# Patient Record
Sex: Male | Born: 1940
Health system: Southern US, Community
[De-identification: ages and names within clinical notes are randomized; demographics above are authoritative.]

## PROBLEM LIST (undated history)

## (undated) DIAGNOSIS — K219 Gastro-esophageal reflux disease without esophagitis: Secondary | ICD-10-CM

## (undated) DIAGNOSIS — R918 Other nonspecific abnormal finding of lung field: Secondary | ICD-10-CM

## (undated) DIAGNOSIS — I739 Peripheral vascular disease, unspecified: Secondary | ICD-10-CM

## (undated) DIAGNOSIS — R6883 Chills (without fever): Secondary | ICD-10-CM

## (undated) DIAGNOSIS — I723 Aneurysm of iliac artery: Secondary | ICD-10-CM

## (undated) DIAGNOSIS — R509 Fever, unspecified: Secondary | ICD-10-CM

## (undated) DIAGNOSIS — I10 Essential (primary) hypertension: Secondary | ICD-10-CM

## (undated) DIAGNOSIS — C801 Malignant (primary) neoplasm, unspecified: Secondary | ICD-10-CM

## (undated) DIAGNOSIS — K439 Ventral hernia without obstruction or gangrene: Secondary | ICD-10-CM

## (undated) DIAGNOSIS — R079 Chest pain, unspecified: Secondary | ICD-10-CM

## (undated) DIAGNOSIS — M179 Osteoarthritis of knee, unspecified: Secondary | ICD-10-CM

## (undated) DIAGNOSIS — I679 Cerebrovascular disease, unspecified: Secondary | ICD-10-CM

## (undated) DIAGNOSIS — H9319 Tinnitus, unspecified ear: Secondary | ICD-10-CM

## (undated) DIAGNOSIS — G8929 Other chronic pain: Secondary | ICD-10-CM

## (undated) DIAGNOSIS — N400 Enlarged prostate without lower urinary tract symptoms: Secondary | ICD-10-CM

## (undated) DIAGNOSIS — J449 Chronic obstructive pulmonary disease, unspecified: Secondary | ICD-10-CM

## (undated) DIAGNOSIS — G629 Polyneuropathy, unspecified: Secondary | ICD-10-CM

## (undated) DIAGNOSIS — Z87898 Personal history of other specified conditions: Secondary | ICD-10-CM

## (undated) DIAGNOSIS — M1990 Unspecified osteoarthritis, unspecified site: Secondary | ICD-10-CM

## (undated) DIAGNOSIS — E785 Hyperlipidemia, unspecified: Secondary | ICD-10-CM

## (undated) DIAGNOSIS — K5730 Diverticulosis of large intestine without perforation or abscess without bleeding: Secondary | ICD-10-CM

## (undated) DIAGNOSIS — R109 Unspecified abdominal pain: Secondary | ICD-10-CM

## (undated) DIAGNOSIS — N200 Calculus of kidney: Secondary | ICD-10-CM

## (undated) DIAGNOSIS — M549 Dorsalgia, unspecified: Secondary | ICD-10-CM

## (undated) DIAGNOSIS — R39198 Other difficulties with micturition: Secondary | ICD-10-CM

## (undated) DIAGNOSIS — Z8701 Personal history of pneumonia (recurrent): Secondary | ICD-10-CM

## (undated) DIAGNOSIS — M1710 Unilateral primary osteoarthritis, unspecified knee: Secondary | ICD-10-CM

## (undated) HISTORY — PX: CHOLECYSTECTOMY: SHX55

## (undated) HISTORY — DX: Chest pain, unspecified: R07.9

## (undated) HISTORY — DX: Tinnitus, unspecified ear: H93.19

## (undated) HISTORY — DX: Malignant (primary) neoplasm, unspecified: C80.1

## (undated) HISTORY — DX: Essential (primary) hypertension: I10

## (undated) HISTORY — DX: Diverticulosis of large intestine without perforation or abscess without bleeding: K57.30

## (undated) HISTORY — DX: Chills (without fever): R68.83

## (undated) HISTORY — DX: Osteoarthritis of knee, unspecified: M17.9

## (undated) HISTORY — DX: Hyperlipidemia, unspecified: E78.5

## (undated) HISTORY — DX: Unspecified osteoarthritis, unspecified site: M19.90

## (undated) HISTORY — DX: Other nonspecific abnormal finding of lung field: R91.8

## (undated) HISTORY — DX: Unilateral primary osteoarthritis, unspecified knee: M17.10

## (undated) HISTORY — PX: APPENDECTOMY: SHX54

## (undated) HISTORY — DX: Other difficulties with micturition: R39.198

## (undated) HISTORY — DX: Polyneuropathy, unspecified: G62.9

## (undated) HISTORY — DX: Peripheral vascular disease, unspecified: I73.9

## (undated) HISTORY — DX: Dorsalgia, unspecified: M54.9

## (undated) HISTORY — PX: ABDOMINAL AORTIC ANEURYSM REPAIR: SUR1152

## (undated) HISTORY — DX: Fever, unspecified: R50.9

## (undated) HISTORY — DX: Aneurysm of iliac artery: I72.3

## (undated) HISTORY — DX: Personal history of other specified conditions: Z87.898

## (undated) HISTORY — DX: Other chronic pain: G89.29

## (undated) HISTORY — PX: ESOPHAGOGASTRODUODENOSCOPY: SHX1529

## (undated) HISTORY — DX: Chronic obstructive pulmonary disease, unspecified: J44.9

## (undated) HISTORY — DX: Ventral hernia without obstruction or gangrene: K43.9

## (undated) HISTORY — DX: Gastro-esophageal reflux disease without esophagitis: K21.9

## (undated) HISTORY — DX: Personal history of pneumonia (recurrent): Z87.01

## (undated) HISTORY — PX: OTHER SURGICAL HISTORY: SHX169

## (undated) HISTORY — PX: HERNIA REPAIR: SHX51

## (undated) HISTORY — PX: AORTA SURGERY: SHX548

## (undated) HISTORY — DX: Unspecified abdominal pain: R10.9

## (undated) HISTORY — DX: Cerebrovascular disease, unspecified: I67.9

## (undated) HISTORY — DX: Calculus of kidney: N20.0

## (undated) HISTORY — DX: Benign prostatic hyperplasia without lower urinary tract symptoms: N40.0

---

## 1998-10-03 ENCOUNTER — Encounter: Payer: Self-pay | Admitting: Internal Medicine

## 1998-10-03 ENCOUNTER — Inpatient Hospital Stay (HOSPITAL_COMMUNITY): Admission: EM | Admit: 1998-10-03 | Discharge: 1998-10-06 | Payer: Self-pay | Admitting: Internal Medicine

## 1998-10-05 ENCOUNTER — Encounter: Payer: Self-pay | Admitting: Vascular Surgery

## 1998-11-11 ENCOUNTER — Emergency Department (HOSPITAL_COMMUNITY): Admission: EM | Admit: 1998-11-11 | Discharge: 1998-11-11 | Payer: Self-pay | Admitting: Emergency Medicine

## 1998-11-11 ENCOUNTER — Encounter: Payer: Self-pay | Admitting: Emergency Medicine

## 1999-12-16 ENCOUNTER — Ambulatory Visit (HOSPITAL_COMMUNITY): Admission: RE | Admit: 1999-12-16 | Discharge: 1999-12-16 | Payer: Self-pay | Admitting: Neurosurgery

## 1999-12-28 ENCOUNTER — Ambulatory Visit (HOSPITAL_COMMUNITY): Admission: RE | Admit: 1999-12-28 | Discharge: 1999-12-28 | Payer: Self-pay | Admitting: Neurosurgery

## 2000-12-29 ENCOUNTER — Encounter: Admission: RE | Admit: 2000-12-29 | Discharge: 2000-12-29 | Payer: Self-pay | Admitting: Internal Medicine

## 2000-12-29 ENCOUNTER — Encounter: Payer: Self-pay | Admitting: Internal Medicine

## 2001-01-02 ENCOUNTER — Encounter: Payer: Self-pay | Admitting: Internal Medicine

## 2001-01-02 ENCOUNTER — Encounter: Admission: RE | Admit: 2001-01-02 | Discharge: 2001-01-02 | Payer: Self-pay | Admitting: Internal Medicine

## 2001-01-14 ENCOUNTER — Encounter: Payer: Self-pay | Admitting: *Deleted

## 2001-01-16 ENCOUNTER — Ambulatory Visit (HOSPITAL_COMMUNITY): Admission: RE | Admit: 2001-01-16 | Discharge: 2001-01-16 | Payer: Self-pay | Admitting: *Deleted

## 2001-02-17 ENCOUNTER — Encounter: Payer: Self-pay | Admitting: *Deleted

## 2001-02-19 ENCOUNTER — Encounter: Payer: Self-pay | Admitting: *Deleted

## 2001-02-19 ENCOUNTER — Inpatient Hospital Stay (HOSPITAL_COMMUNITY): Admission: RE | Admit: 2001-02-19 | Discharge: 2001-02-26 | Payer: Self-pay | Admitting: *Deleted

## 2001-02-19 ENCOUNTER — Encounter (INDEPENDENT_AMBULATORY_CARE_PROVIDER_SITE_OTHER): Payer: Self-pay | Admitting: *Deleted

## 2001-02-20 ENCOUNTER — Encounter: Payer: Self-pay | Admitting: *Deleted

## 2001-03-09 ENCOUNTER — Encounter: Payer: Self-pay | Admitting: Vascular Surgery

## 2001-03-09 ENCOUNTER — Emergency Department (HOSPITAL_COMMUNITY): Admission: EM | Admit: 2001-03-09 | Discharge: 2001-03-09 | Payer: Self-pay

## 2001-06-30 ENCOUNTER — Encounter: Admission: RE | Admit: 2001-06-30 | Discharge: 2001-06-30 | Payer: Self-pay | Admitting: Neurosurgery

## 2001-07-17 ENCOUNTER — Encounter: Payer: Self-pay | Admitting: *Deleted

## 2001-07-17 ENCOUNTER — Encounter: Payer: Self-pay | Admitting: Emergency Medicine

## 2001-07-17 ENCOUNTER — Emergency Department (HOSPITAL_COMMUNITY): Admission: EM | Admit: 2001-07-17 | Discharge: 2001-07-17 | Payer: Self-pay | Admitting: *Deleted

## 2001-12-22 ENCOUNTER — Encounter (INDEPENDENT_AMBULATORY_CARE_PROVIDER_SITE_OTHER): Payer: Self-pay | Admitting: *Deleted

## 2001-12-22 ENCOUNTER — Ambulatory Visit (HOSPITAL_COMMUNITY): Admission: RE | Admit: 2001-12-22 | Discharge: 2001-12-22 | Payer: Self-pay | Admitting: Gastroenterology

## 2001-12-22 ENCOUNTER — Encounter: Payer: Self-pay | Admitting: Gastroenterology

## 2001-12-30 ENCOUNTER — Encounter: Admission: RE | Admit: 2001-12-30 | Discharge: 2001-12-30 | Payer: Self-pay | Admitting: Gastroenterology

## 2001-12-30 ENCOUNTER — Encounter: Payer: Self-pay | Admitting: Gastroenterology

## 2002-05-06 ENCOUNTER — Ambulatory Visit (HOSPITAL_BASED_OUTPATIENT_CLINIC_OR_DEPARTMENT_OTHER): Admission: RE | Admit: 2002-05-06 | Discharge: 2002-05-06 | Payer: Self-pay | Admitting: Internal Medicine

## 2002-07-19 ENCOUNTER — Ambulatory Visit (HOSPITAL_BASED_OUTPATIENT_CLINIC_OR_DEPARTMENT_OTHER): Admission: RE | Admit: 2002-07-19 | Discharge: 2002-07-19 | Payer: Self-pay | Admitting: Internal Medicine

## 2002-10-26 ENCOUNTER — Ambulatory Visit (HOSPITAL_BASED_OUTPATIENT_CLINIC_OR_DEPARTMENT_OTHER): Admission: RE | Admit: 2002-10-26 | Discharge: 2002-10-27 | Payer: Self-pay | Admitting: Surgery

## 2004-08-09 ENCOUNTER — Ambulatory Visit (HOSPITAL_COMMUNITY): Admission: RE | Admit: 2004-08-09 | Discharge: 2004-08-09 | Payer: Self-pay | Admitting: Orthopaedic Surgery

## 2004-10-05 ENCOUNTER — Encounter: Admission: RE | Admit: 2004-10-05 | Discharge: 2004-10-05 | Payer: Self-pay | Admitting: Internal Medicine

## 2004-12-11 ENCOUNTER — Encounter: Admission: RE | Admit: 2004-12-11 | Discharge: 2004-12-11 | Payer: Self-pay | Admitting: Internal Medicine

## 2005-02-14 ENCOUNTER — Inpatient Hospital Stay (HOSPITAL_COMMUNITY): Admission: RE | Admit: 2005-02-14 | Discharge: 2005-02-21 | Payer: Self-pay | Admitting: General Surgery

## 2005-02-14 ENCOUNTER — Encounter (INDEPENDENT_AMBULATORY_CARE_PROVIDER_SITE_OTHER): Payer: Self-pay | Admitting: *Deleted

## 2005-02-21 ENCOUNTER — Ambulatory Visit: Payer: Self-pay | Admitting: Hematology and Oncology

## 2005-03-11 ENCOUNTER — Ambulatory Visit (HOSPITAL_COMMUNITY): Admission: RE | Admit: 2005-03-11 | Discharge: 2005-03-11 | Payer: Self-pay | Admitting: Hematology and Oncology

## 2005-04-25 ENCOUNTER — Encounter: Admission: RE | Admit: 2005-04-25 | Discharge: 2005-04-25 | Payer: Self-pay | Admitting: General Surgery

## 2005-05-24 ENCOUNTER — Ambulatory Visit (HOSPITAL_COMMUNITY): Admission: RE | Admit: 2005-05-24 | Discharge: 2005-05-24 | Payer: Self-pay | Admitting: Hematology and Oncology

## 2005-06-05 ENCOUNTER — Ambulatory Visit: Payer: Self-pay | Admitting: Hematology and Oncology

## 2005-08-14 ENCOUNTER — Encounter: Admission: RE | Admit: 2005-08-14 | Discharge: 2005-08-14 | Payer: Self-pay | Admitting: Internal Medicine

## 2005-09-19 ENCOUNTER — Ambulatory Visit (HOSPITAL_COMMUNITY): Admission: RE | Admit: 2005-09-19 | Discharge: 2005-09-19 | Payer: Self-pay | Admitting: Hematology and Oncology

## 2005-09-23 ENCOUNTER — Ambulatory Visit: Payer: Self-pay | Admitting: Hematology and Oncology

## 2005-11-21 ENCOUNTER — Emergency Department (HOSPITAL_COMMUNITY): Admission: EM | Admit: 2005-11-21 | Discharge: 2005-11-21 | Payer: Self-pay | Admitting: Emergency Medicine

## 2006-01-10 ENCOUNTER — Ambulatory Visit: Payer: Self-pay | Admitting: Hematology and Oncology

## 2006-01-14 ENCOUNTER — Ambulatory Visit (HOSPITAL_COMMUNITY): Admission: RE | Admit: 2006-01-14 | Discharge: 2006-01-14 | Payer: Self-pay | Admitting: Hematology and Oncology

## 2006-01-14 LAB — CBC WITH DIFFERENTIAL/PLATELET
BASO%: 0.3 % (ref 0.0–2.0)
Eosinophils Absolute: 0 10*3/uL (ref 0.0–0.5)
HCT: 42.5 % (ref 38.7–49.9)
MCHC: 34.3 g/dL (ref 32.0–35.9)
MONO#: 0.6 10*3/uL (ref 0.1–0.9)
NEUT#: 3.5 10*3/uL (ref 1.5–6.5)
NEUT%: 58.8 % (ref 40.0–75.0)
Platelets: 220 10*3/uL (ref 145–400)
RBC: 4.48 10*6/uL (ref 4.20–5.71)
WBC: 6 10*3/uL (ref 4.0–10.0)
lymph#: 1.8 10*3/uL (ref 0.9–3.3)

## 2006-01-14 LAB — COMPREHENSIVE METABOLIC PANEL
ALT: 25 U/L (ref 0–40)
CO2: 28 mEq/L (ref 19–32)
Calcium: 9.6 mg/dL (ref 8.4–10.5)
Chloride: 105 mEq/L (ref 96–112)
Glucose, Bld: 101 mg/dL — ABNORMAL HIGH (ref 70–99)
Sodium: 141 mEq/L (ref 135–145)
Total Protein: 7 g/dL (ref 6.0–8.3)

## 2006-04-16 ENCOUNTER — Ambulatory Visit: Payer: Self-pay | Admitting: Hematology and Oncology

## 2006-04-18 LAB — CBC WITH DIFFERENTIAL/PLATELET
BASO%: 0.3 % (ref 0.0–2.0)
Basophils Absolute: 0 10*3/uL (ref 0.0–0.1)
Eosinophils Absolute: 0 10*3/uL (ref 0.0–0.5)
HCT: 39.6 % (ref 38.7–49.9)
HGB: 13.8 g/dL (ref 13.0–17.1)
LYMPH%: 32.5 % (ref 14.0–48.0)
MONO#: 0.3 10*3/uL (ref 0.1–0.9)
NEUT#: 2.9 10*3/uL (ref 1.5–6.5)
NEUT%: 60 % (ref 40.0–75.0)
Platelets: 211 10*3/uL (ref 145–400)
WBC: 4.8 10*3/uL (ref 4.0–10.0)
lymph#: 1.6 10*3/uL (ref 0.9–3.3)

## 2006-04-18 LAB — COMPREHENSIVE METABOLIC PANEL
ALT: 21 U/L (ref 0–40)
CO2: 21 mEq/L (ref 19–32)
Calcium: 9.1 mg/dL (ref 8.4–10.5)
Chloride: 107 mEq/L (ref 96–112)
Creatinine, Ser: 1.36 mg/dL (ref 0.40–1.50)
Glucose, Bld: 112 mg/dL — ABNORMAL HIGH (ref 70–99)
Total Bilirubin: 0.5 mg/dL (ref 0.3–1.2)

## 2006-06-05 IMAGING — CT CT ANGIO CHEST
4 of 7 series · 16 of 30 positions shown · IV contrast (omnipaque)
Comparison: CT chest of 09/19/2005.

CLINICAL DATA: Midchest pain.  Recent AAA repair.  
CT ANGIOGRAPHY OF CHEST:
TECHNIQUE: Multidetector CT imaging of the chest was performed during bolus injection of intravenous contrast.  Multiplanar CT angiographic image reconstructions were generated to evaluate the vascular anatomy.
Contrast:  120 cc Omnipaque 300.
TECHNIQUE: Multidetector CT imaging of the abdomen was performed during bolus injection of intravenous contrast.  Multiplanar CT angiographic image reconstructions were generated to evaluate the vascular anatomy.
Scans were continued through the abdomen after IV contrast media was given for CT angio abdomen.  Atheromatous changes are noted throughout the abdominal aorta.  The origin of the SMA, celiac axis, and renal arteries are patent.  The patient has undergone prior AAA repair and the iliac limbs opacify well.  The remainder of the study shows probable fatty infiltration of the liver without focal abnormality.  The gallbladder has previously been removed.  The pancreas is normal in size with normal peripancreatic fat planes.  The adrenal glands and spleen appear normal.  The kidneys enhance normally and the pelvocaliceal systems appear normal.  The appendix appears normal.  There are diverticula in the rectosigmoid colon.

[Series 5: aortic disection · axial · 0.74mm/px · z∈[-511,-116]mm · 7 of 222 slices shown]
[im 32/222  lung]
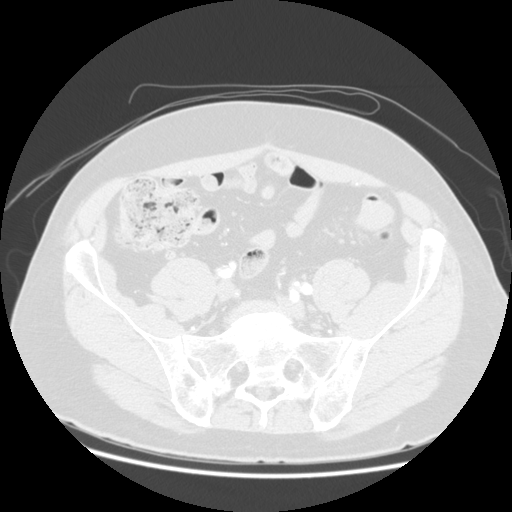
[im 64/222  mediastinal]
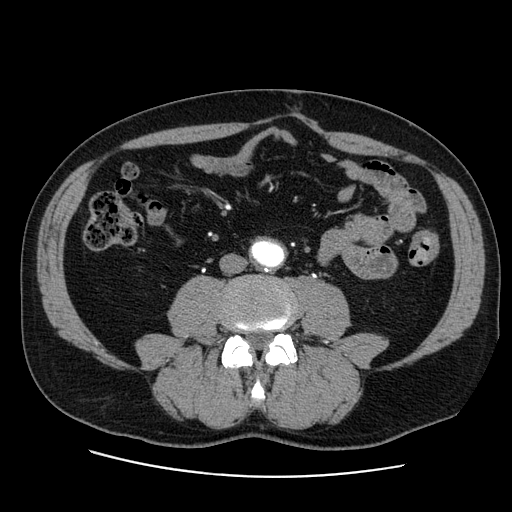
[im 95/222  lung]
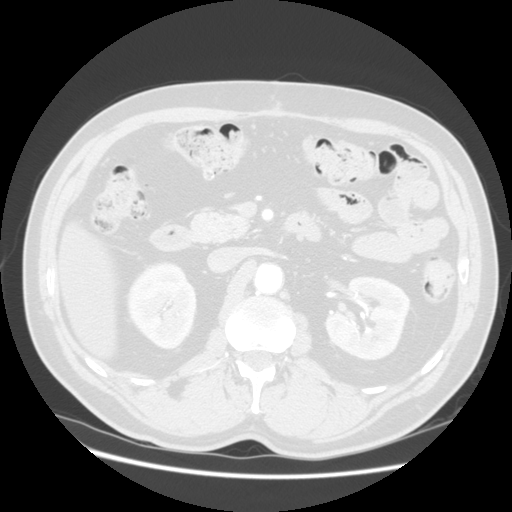
[im 110/222  mediastinal]
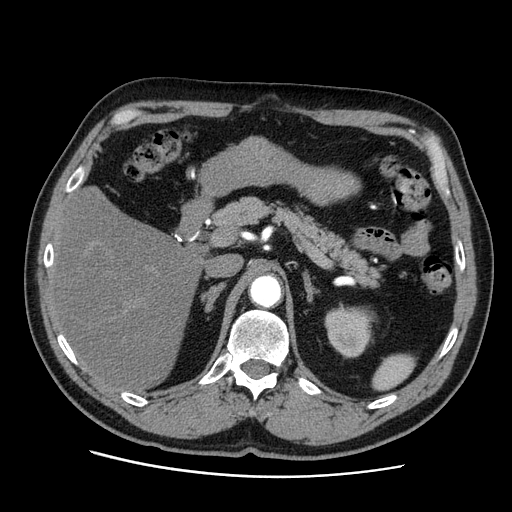
[im 127/222  lung]
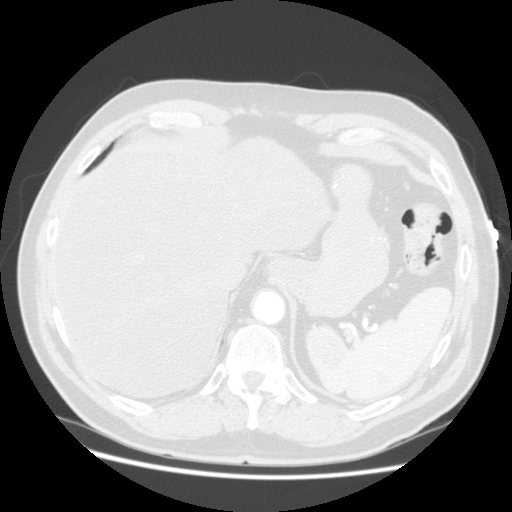
[im 158/222  mediastinal]
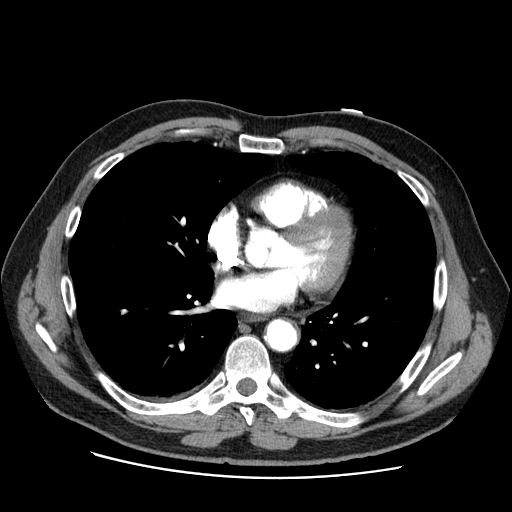
[im 190/222  lung]
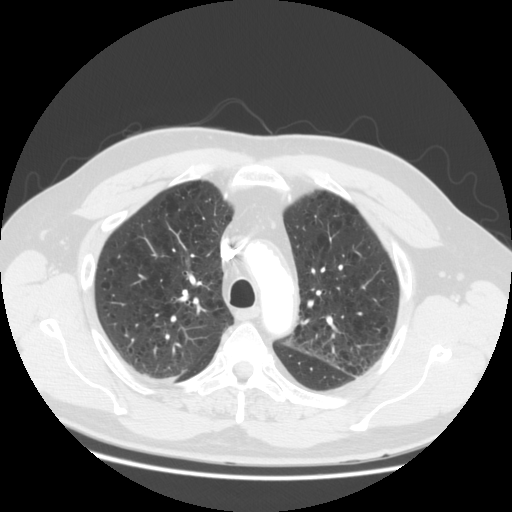

[Series 6: recon 2: aortic disection · axial · 0.74mm/px · z∈[-216,-126]mm · 2 of 108 slices shown]
[im 36/108  lung]
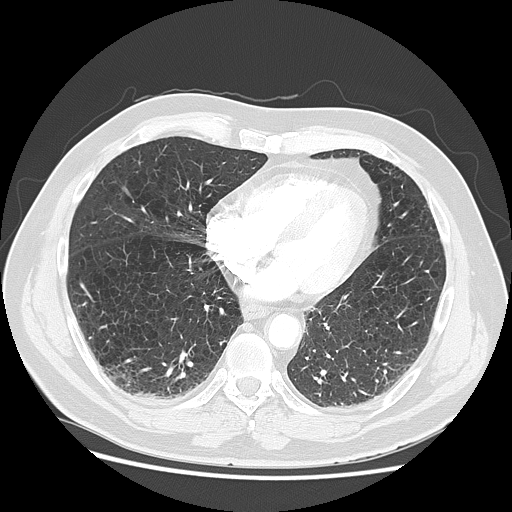
[im 72/108  lung]
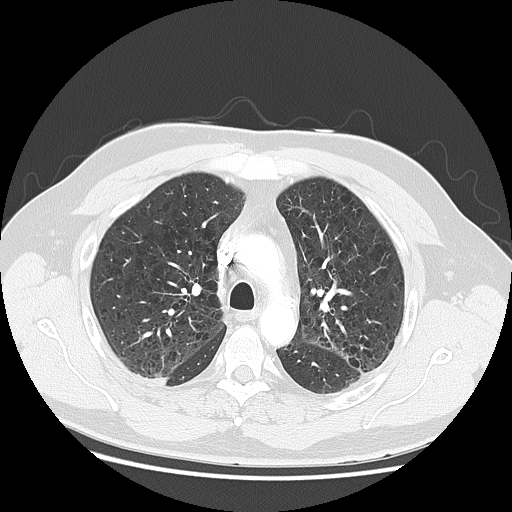

[Series 106: reformatted · sagittal · 1.11mm/px · 4 of 163 slices shown (1 of 2)]
[im 33/163  lung]
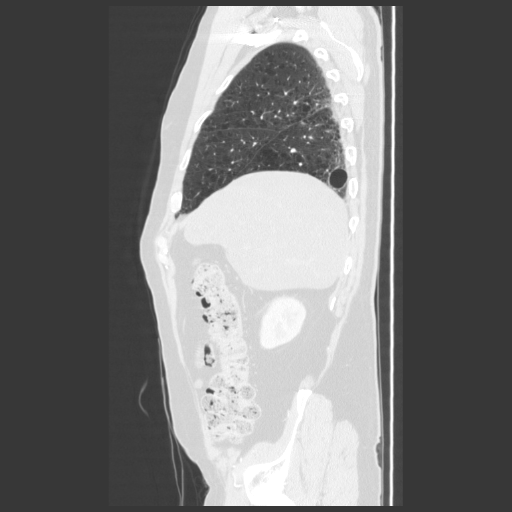
[im 65/163  lung]
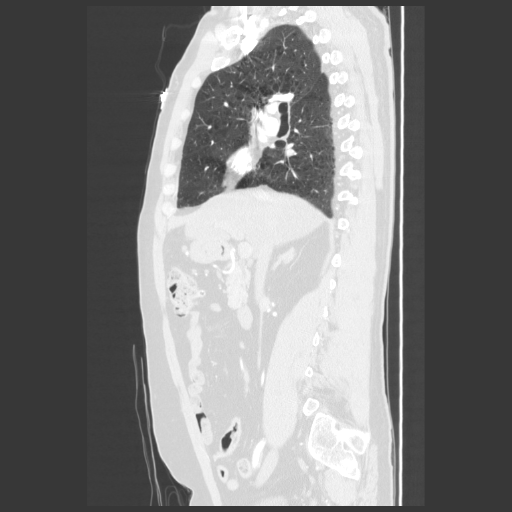
[im 98/163  lung]
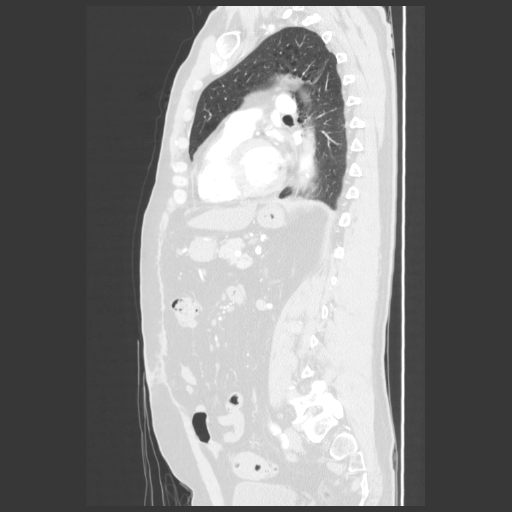
[im 130/163  lung]
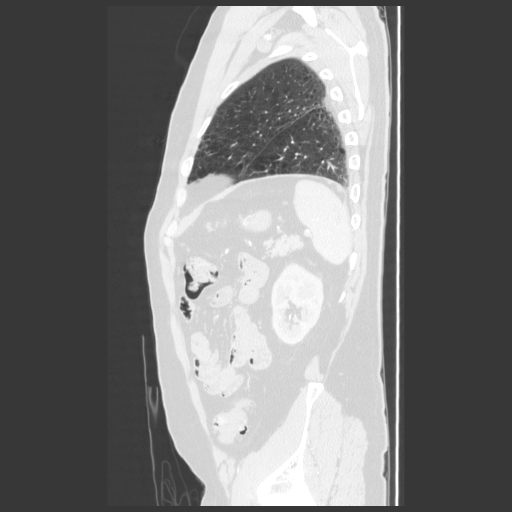

[Series 107: reformatted · coronal · 1.11mm/px · 3 of 134 slices shown (2 of 2)]
[im 34/134  lung]
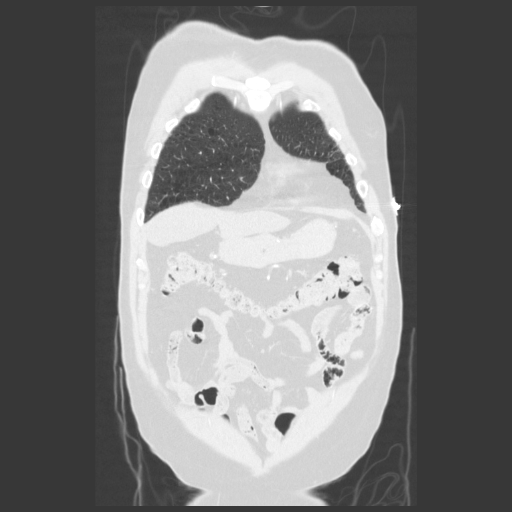
[im 67/134  lung]
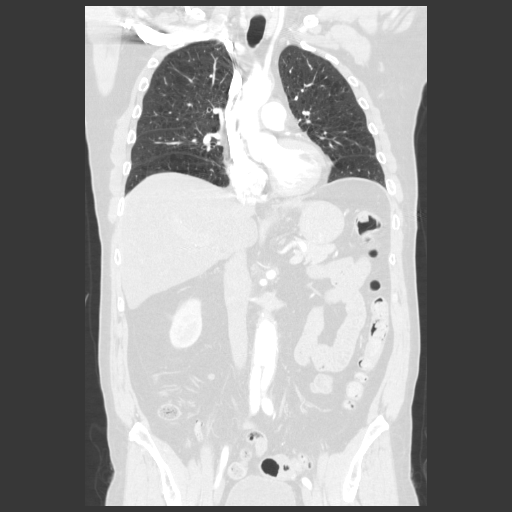
[im 100/134  lung]
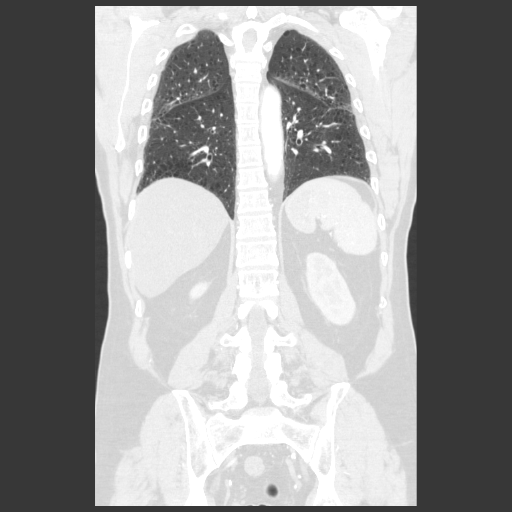

[16 of 30 positions shown; findings below may reference images not displayed]

The pulmonary arteries opacify well.  There is no evidence of pulmonary embolism.  The thoracic aorta also opacifies well.  There are atheromatous changes in the aortic arch and in the descending thoracic aorta.  No dissection is seen.  On lung window images there are emphysematous changes throughout the lungs.  No lung nodule is seen.  No pleural effusion is present.
IMPRESSION: 1.  No evidence of pulmonary embolism.  
2.  No thoracic aortic dissection.  Atheromatous changes are noted from the aortic arch into the descending thoracic aorta.  
3.  COPD. 
CT ANGIOGRAPHY OF ABDOMEN:
IMPRESSION: 1.  No evidence of AAA or aortic dissection.  ortoiliac bypass graft which is patent.  
2.  Appendix appears normal.  
3.  Sigmoid colon diverticula.

## 2006-07-29 ENCOUNTER — Ambulatory Visit: Payer: Self-pay | Admitting: Hematology and Oncology

## 2006-08-06 LAB — CBC WITH DIFFERENTIAL/PLATELET
BASO%: 0.5 % (ref 0.0–2.0)
Basophils Absolute: 0 10*3/uL (ref 0.0–0.1)
HCT: 41.3 % (ref 38.7–49.9)
HGB: 14.2 g/dL (ref 13.0–17.1)
LYMPH%: 34.2 % (ref 14.0–48.0)
MCH: 33 pg (ref 28.0–33.4)
MCHC: 34.4 g/dL (ref 32.0–35.9)
MONO#: 0.4 10*3/uL (ref 0.1–0.9)
NEUT%: 55.9 % (ref 40.0–75.0)
Platelets: 172 10*3/uL (ref 145–400)
WBC: 4.6 10*3/uL (ref 4.0–10.0)

## 2006-08-06 LAB — COMPREHENSIVE METABOLIC PANEL
ALT: 25 U/L (ref 0–53)
BUN: 13 mg/dL (ref 6–23)
CO2: 29 mEq/L (ref 19–32)
Calcium: 8.9 mg/dL (ref 8.4–10.5)
Chloride: 104 mEq/L (ref 96–112)
Creatinine, Ser: 1.38 mg/dL (ref 0.40–1.50)
Total Bilirubin: 0.8 mg/dL (ref 0.3–1.2)

## 2006-08-07 ENCOUNTER — Ambulatory Visit (HOSPITAL_COMMUNITY): Admission: RE | Admit: 2006-08-07 | Discharge: 2006-08-07 | Payer: Self-pay | Admitting: Hematology and Oncology

## 2006-12-11 ENCOUNTER — Ambulatory Visit (HOSPITAL_COMMUNITY): Admission: RE | Admit: 2006-12-11 | Discharge: 2006-12-12 | Payer: Self-pay | Admitting: Orthopedic Surgery

## 2007-01-12 ENCOUNTER — Ambulatory Visit: Payer: Self-pay | Admitting: Hematology and Oncology

## 2007-01-14 LAB — CBC WITH DIFFERENTIAL/PLATELET
BASO%: 0.3 % (ref 0.0–2.0)
Basophils Absolute: 0 10*3/uL (ref 0.0–0.1)
EOS%: 3.9 % (ref 0.0–7.0)
HGB: 13.1 g/dL (ref 13.0–17.1)
MCH: 32.6 pg (ref 28.0–33.4)
MCHC: 34.6 g/dL (ref 32.0–35.9)
MCV: 94.2 fL (ref 81.6–98.0)
MONO%: 7.2 % (ref 0.0–13.0)
NEUT%: 63.4 % (ref 40.0–75.0)
RDW: 12.4 % (ref 11.2–14.6)
lymph#: 1.2 10*3/uL (ref 0.9–3.3)

## 2007-01-14 LAB — COMPREHENSIVE METABOLIC PANEL
ALT: 10 U/L (ref 0–53)
AST: 12 U/L (ref 0–37)
Alkaline Phosphatase: 86 U/L (ref 39–117)
BUN: 8 mg/dL (ref 6–23)
Creatinine, Ser: 1.35 mg/dL (ref 0.40–1.50)

## 2007-01-16 ENCOUNTER — Ambulatory Visit (HOSPITAL_COMMUNITY): Admission: RE | Admit: 2007-01-16 | Discharge: 2007-01-16 | Payer: Self-pay | Admitting: Hematology and Oncology

## 2007-03-12 ENCOUNTER — Ambulatory Visit: Payer: Self-pay | Admitting: Oncology

## 2007-03-17 LAB — CBC WITH DIFFERENTIAL/PLATELET
BASO%: 0.2 % (ref 0.0–2.0)
Basophils Absolute: 0 10*3/uL (ref 0.0–0.1)
EOS%: 0.2 % (ref 0.0–7.0)
HCT: 36.9 % — ABNORMAL LOW (ref 38.7–49.9)
HGB: 13 g/dL (ref 13.0–17.1)
LYMPH%: 17.2 % (ref 14.0–48.0)
MCH: 33.5 pg — ABNORMAL HIGH (ref 28.0–33.4)
MCHC: 35.2 g/dL (ref 32.0–35.9)
MCV: 95.1 fL (ref 81.6–98.0)
MONO%: 6.8 % (ref 0.0–13.0)
NEUT%: 75.6 % — ABNORMAL HIGH (ref 40.0–75.0)
Platelets: 173 10*3/uL (ref 145–400)

## 2007-03-17 LAB — COMPREHENSIVE METABOLIC PANEL
AST: 11 U/L (ref 0–37)
Alkaline Phosphatase: 81 U/L (ref 39–117)
BUN: 9 mg/dL (ref 6–23)
Calcium: 8.9 mg/dL (ref 8.4–10.5)
Chloride: 105 mEq/L (ref 96–112)
Creatinine, Ser: 1.4 mg/dL (ref 0.40–1.50)
Total Bilirubin: 0.9 mg/dL (ref 0.3–1.2)

## 2007-03-18 ENCOUNTER — Ambulatory Visit (HOSPITAL_COMMUNITY): Admission: RE | Admit: 2007-03-18 | Discharge: 2007-03-18 | Payer: Self-pay | Admitting: Oncology

## 2007-04-06 ENCOUNTER — Encounter: Admission: RE | Admit: 2007-04-06 | Discharge: 2007-04-06 | Payer: Self-pay | Admitting: General Surgery

## 2007-05-21 ENCOUNTER — Inpatient Hospital Stay (HOSPITAL_COMMUNITY): Admission: RE | Admit: 2007-05-21 | Discharge: 2007-05-23 | Payer: Self-pay | Admitting: General Surgery

## 2007-09-10 DIAGNOSIS — Z87898 Personal history of other specified conditions: Secondary | ICD-10-CM

## 2007-09-10 HISTORY — DX: Personal history of other specified conditions: Z87.898

## 2007-09-21 ENCOUNTER — Ambulatory Visit: Payer: Self-pay | Admitting: Oncology

## 2007-09-23 LAB — COMPREHENSIVE METABOLIC PANEL
Albumin: 4.1 g/dL (ref 3.5–5.2)
BUN: 9 mg/dL (ref 6–23)
CO2: 26 mEq/L (ref 19–32)
Calcium: 8.9 mg/dL (ref 8.4–10.5)
Chloride: 99 mEq/L (ref 96–112)
Glucose, Bld: 92 mg/dL (ref 70–99)
Potassium: 4.2 mEq/L (ref 3.5–5.3)
Total Protein: 6.4 g/dL (ref 6.0–8.3)

## 2007-09-23 LAB — CBC WITH DIFFERENTIAL/PLATELET
Basophils Absolute: 0 10*3/uL (ref 0.0–0.1)
Eosinophils Absolute: 0.1 10*3/uL (ref 0.0–0.5)
HGB: 12.4 g/dL — ABNORMAL LOW (ref 13.0–17.1)
MCV: 96.8 fL (ref 81.6–98.0)
MONO#: 0.4 10*3/uL (ref 0.1–0.9)
MONO%: 5.9 % (ref 0.0–13.0)
NEUT#: 5.3 10*3/uL (ref 1.5–6.5)
RDW: 12.5 % (ref 11.2–14.6)
WBC: 7.5 10*3/uL (ref 4.0–10.0)
lymph#: 1.7 10*3/uL (ref 0.9–3.3)

## 2007-09-29 ENCOUNTER — Ambulatory Visit (HOSPITAL_COMMUNITY): Admission: RE | Admit: 2007-09-29 | Discharge: 2007-09-29 | Payer: Self-pay | Admitting: Hematology and Oncology

## 2007-11-08 DIAGNOSIS — R079 Chest pain, unspecified: Secondary | ICD-10-CM

## 2007-11-08 HISTORY — DX: Chest pain, unspecified: R07.9

## 2008-02-09 ENCOUNTER — Ambulatory Visit: Payer: Self-pay | Admitting: Oncology

## 2008-02-11 LAB — CBC WITH DIFFERENTIAL/PLATELET
Basophils Absolute: 0 10*3/uL (ref 0.0–0.1)
Eosinophils Absolute: 0 10*3/uL (ref 0.0–0.5)
HCT: 34.5 % — ABNORMAL LOW (ref 38.7–49.9)
HGB: 12 g/dL — ABNORMAL LOW (ref 13.0–17.1)
LYMPH%: 27.5 % (ref 14.0–48.0)
MCV: 96.1 fL (ref 81.6–98.0)
MONO#: 0.3 10*3/uL (ref 0.1–0.9)
MONO%: 9.1 % (ref 0.0–13.0)
NEUT#: 2.4 10*3/uL (ref 1.5–6.5)
NEUT%: 63.1 % (ref 40.0–75.0)
Platelets: 231 10*3/uL (ref 145–400)
RBC: 3.59 10*6/uL — ABNORMAL LOW (ref 4.20–5.71)
WBC: 3.8 10*3/uL — ABNORMAL LOW (ref 4.0–10.0)

## 2008-02-11 LAB — LACTATE DEHYDROGENASE: LDH: 127 U/L (ref 94–250)

## 2008-02-11 LAB — COMPREHENSIVE METABOLIC PANEL
BUN: 8 mg/dL (ref 6–23)
CO2: 26 mEq/L (ref 19–32)
Glucose, Bld: 91 mg/dL (ref 70–99)
Sodium: 136 mEq/L (ref 135–145)
Total Bilirubin: 0.5 mg/dL (ref 0.3–1.2)
Total Protein: 6.5 g/dL (ref 6.0–8.3)

## 2008-02-23 ENCOUNTER — Ambulatory Visit (HOSPITAL_COMMUNITY): Admission: RE | Admit: 2008-02-23 | Discharge: 2008-02-23 | Payer: Self-pay | Admitting: Hematology and Oncology

## 2008-03-07 ENCOUNTER — Ambulatory Visit (HOSPITAL_COMMUNITY): Admission: RE | Admit: 2008-03-07 | Discharge: 2008-03-07 | Payer: Self-pay | Admitting: Hematology and Oncology

## 2008-03-18 ENCOUNTER — Ambulatory Visit: Payer: Self-pay | Admitting: Internal Medicine

## 2008-03-18 DIAGNOSIS — I10 Essential (primary) hypertension: Secondary | ICD-10-CM | POA: Insufficient documentation

## 2008-03-18 DIAGNOSIS — G4733 Obstructive sleep apnea (adult) (pediatric): Secondary | ICD-10-CM | POA: Insufficient documentation

## 2008-03-18 DIAGNOSIS — R079 Chest pain, unspecified: Secondary | ICD-10-CM

## 2008-03-18 DIAGNOSIS — J984 Other disorders of lung: Secondary | ICD-10-CM

## 2008-03-18 DIAGNOSIS — Z8719 Personal history of other diseases of the digestive system: Secondary | ICD-10-CM

## 2008-03-22 ENCOUNTER — Ambulatory Visit: Payer: Self-pay | Admitting: Psychiatry

## 2008-06-03 ENCOUNTER — Ambulatory Visit: Payer: Self-pay | Admitting: Hematology and Oncology

## 2008-06-07 LAB — CBC WITH DIFFERENTIAL/PLATELET
Basophils Absolute: 0 10*3/uL (ref 0.0–0.1)
Eosinophils Absolute: 0 10*3/uL (ref 0.0–0.5)
HGB: 12.4 g/dL — ABNORMAL LOW (ref 13.0–17.1)
LYMPH%: 22.7 % (ref 14.0–48.0)
MCV: 101.3 fL — ABNORMAL HIGH (ref 81.6–98.0)
MONO#: 0.4 10*3/uL (ref 0.1–0.9)
NEUT#: 3.5 10*3/uL (ref 1.5–6.5)
Platelets: 214 10*3/uL (ref 145–400)
RBC: 3.6 10*6/uL — ABNORMAL LOW (ref 4.20–5.71)
WBC: 5.1 10*3/uL (ref 4.0–10.0)

## 2008-06-07 LAB — COMPREHENSIVE METABOLIC PANEL
AST: 10 U/L (ref 0–37)
Alkaline Phosphatase: 64 U/L (ref 39–117)
BUN: 10 mg/dL (ref 6–23)
Creatinine, Ser: 1.34 mg/dL (ref 0.40–1.50)
Glucose, Bld: 128 mg/dL — ABNORMAL HIGH (ref 70–99)
Potassium: 4.9 mEq/L (ref 3.5–5.3)
Total Bilirubin: 0.7 mg/dL (ref 0.3–1.2)

## 2008-09-30 IMAGING — CR DG CHEST 2V
2 series · 2 of 2 positions shown · non-contrast
Comparison: CT chest of 02/23/2008 and chest x-ray of 12/08/2006

CLINICAL DATA: Posterior right chest pain, former smoker

CHEST - 2 VIEW

[view not recorded (1 of 2)]
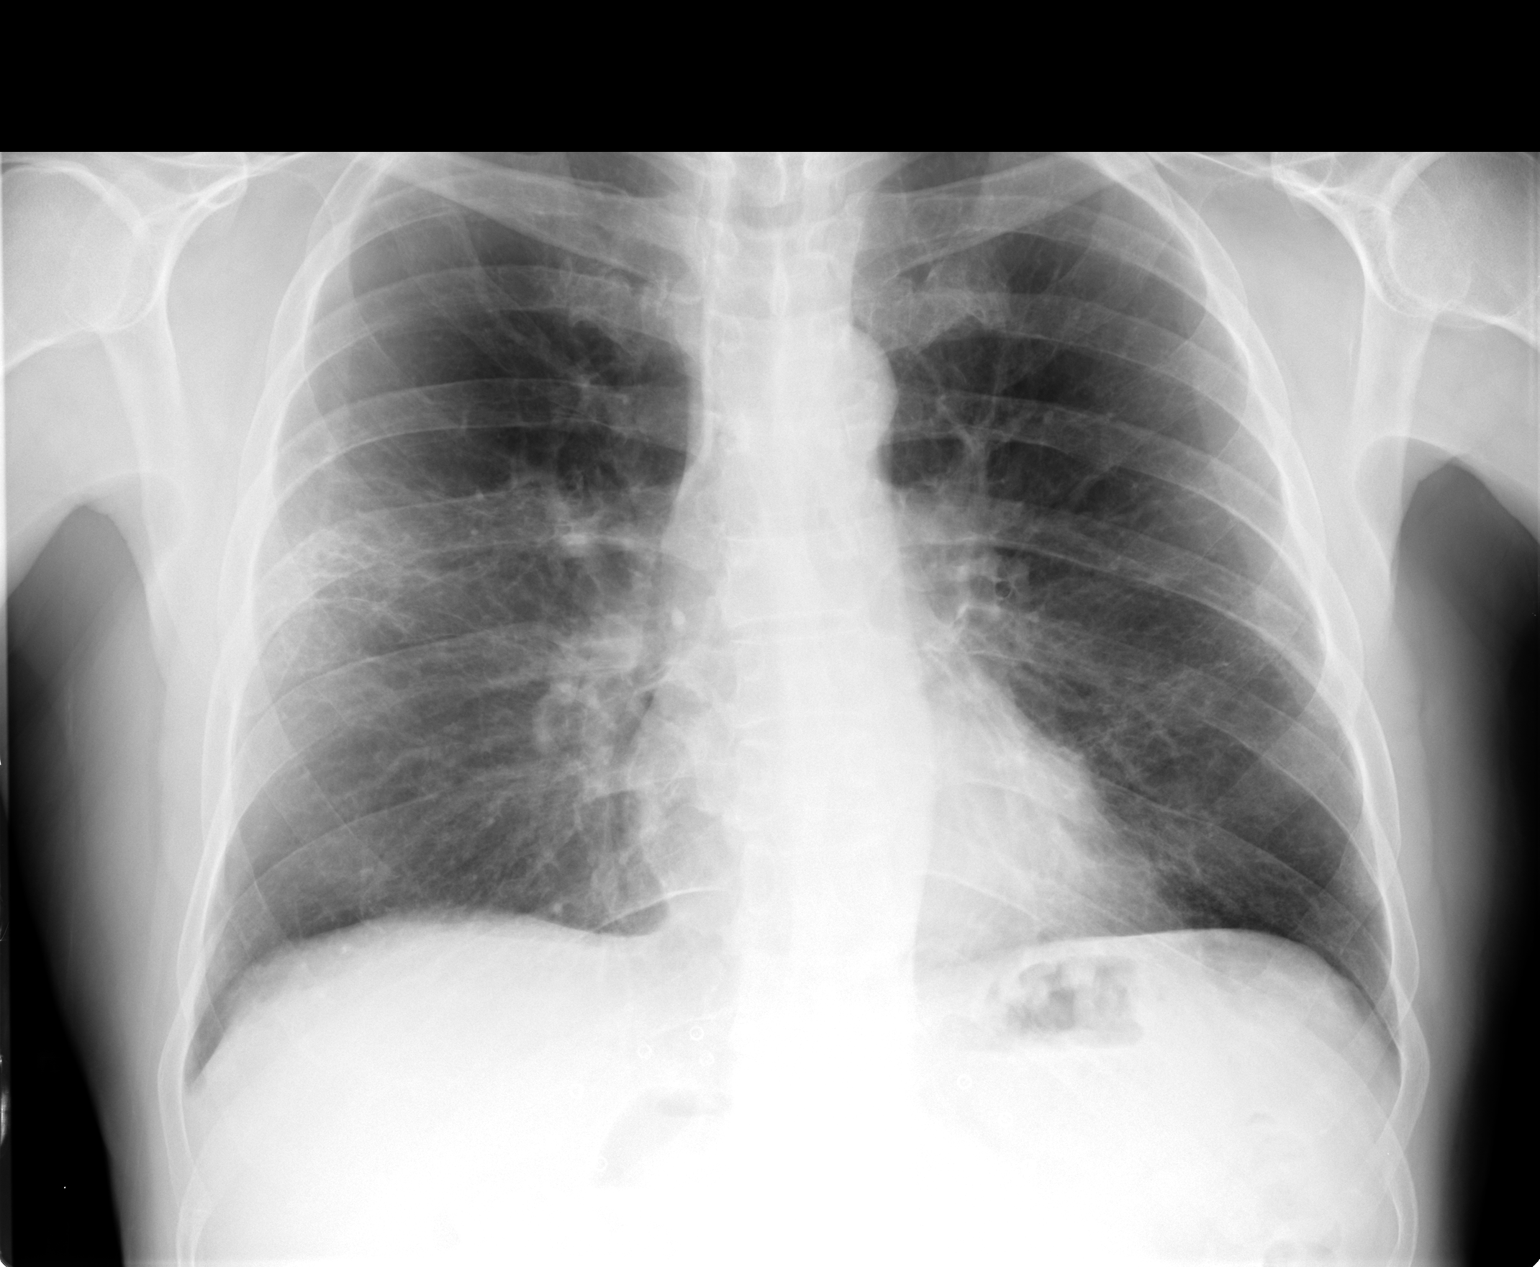

[view not recorded (2 of 2)]
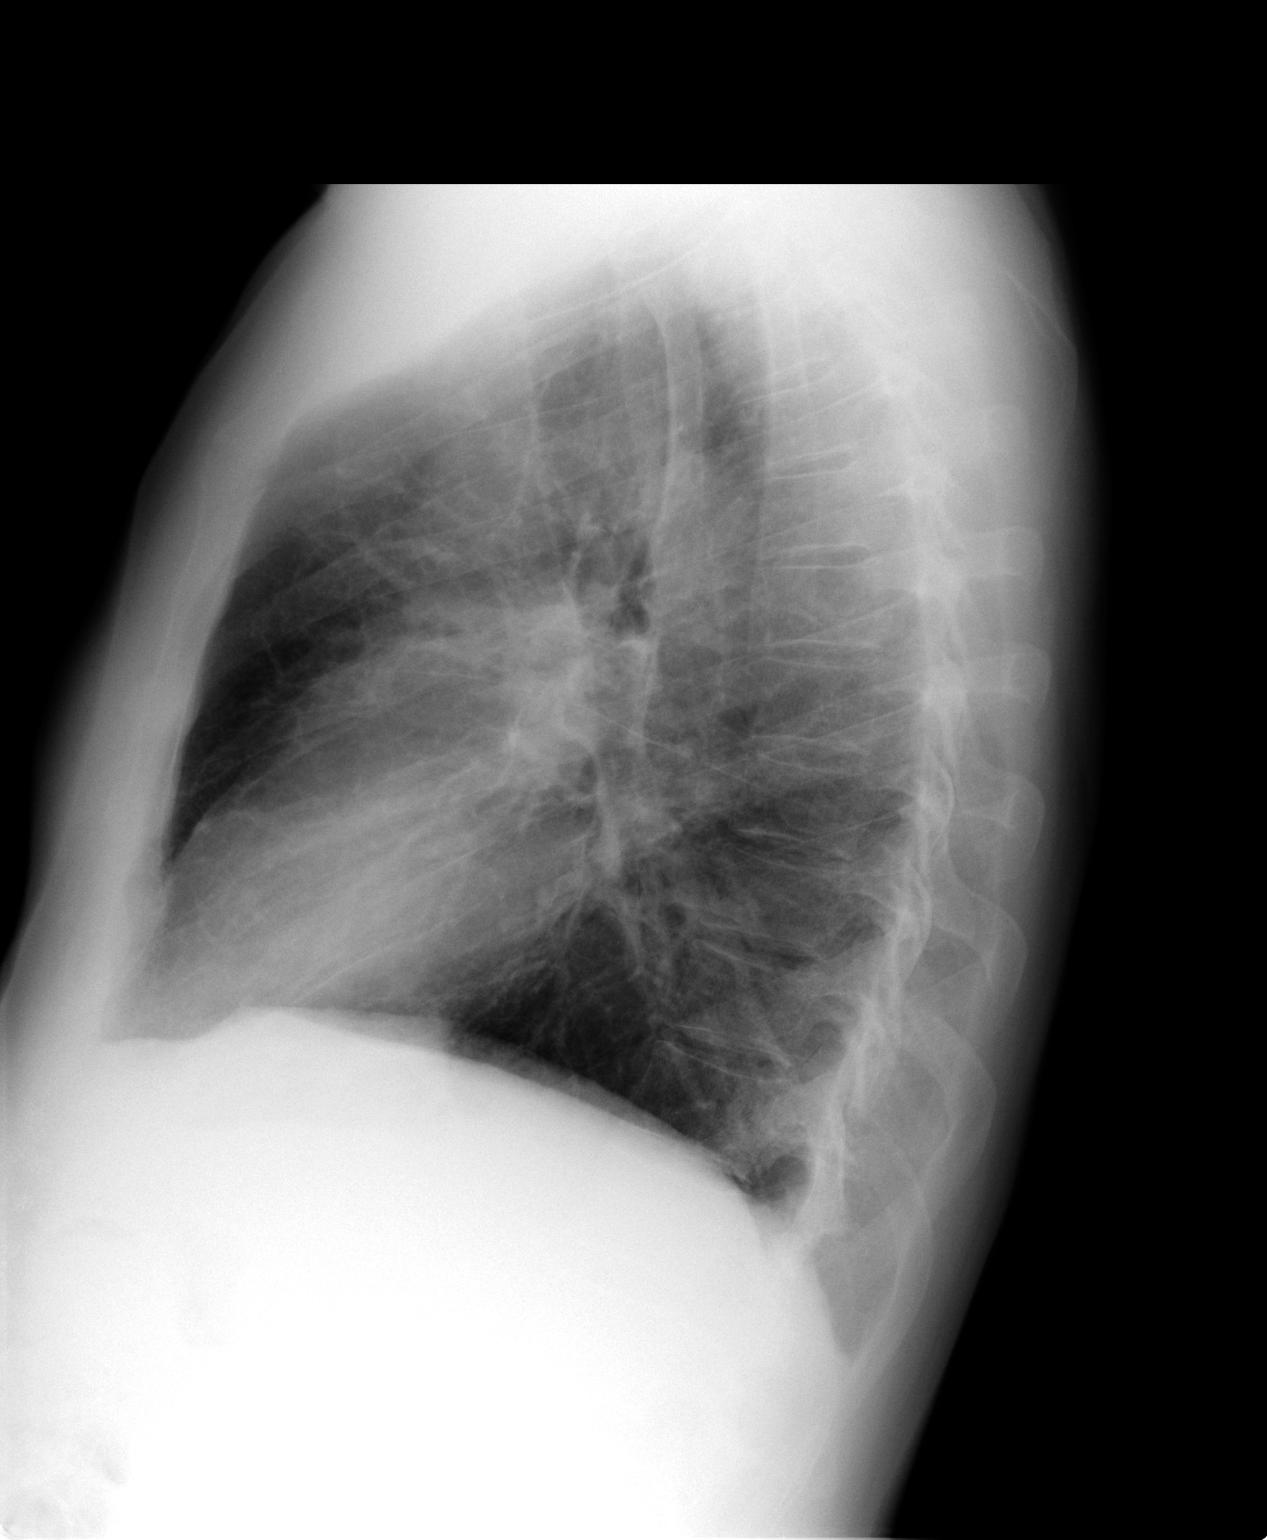

[2 of 2 positions shown; findings below may reference images not displayed]

FINDINGS: There is patchy opacity in the right mid lung
peripherally.  Some of this could be chronic in nature but an
active process is too to exclude.  Follow-up chest x-ray is
recommended.  No other infiltrate or effusion is seen.  Mild
peribronchial thickening is noted.  Heart is within normal limits
in size.  No adenopathy is seen
IMPRESSION: Streaky opacity in the periphery of the right mid lung may be
chronic in nature but cannot exclude active process.  Recommend
follow-up chest x-ray.

## 2008-10-28 ENCOUNTER — Ambulatory Visit: Payer: Self-pay | Admitting: Hematology and Oncology

## 2008-11-01 LAB — COMPREHENSIVE METABOLIC PANEL
ALT: 10 U/L (ref 0–53)
AST: 12 U/L (ref 0–37)
Alkaline Phosphatase: 62 U/L (ref 39–117)
CO2: 26 mEq/L (ref 19–32)
Creatinine, Ser: 1.25 mg/dL (ref 0.40–1.50)
Sodium: 134 mEq/L — ABNORMAL LOW (ref 135–145)
Total Bilirubin: 0.4 mg/dL (ref 0.3–1.2)
Total Protein: 6.5 g/dL (ref 6.0–8.3)

## 2008-11-01 LAB — CBC WITH DIFFERENTIAL/PLATELET
BASO%: 0.3 % (ref 0.0–2.0)
EOS%: 0 % (ref 0.0–7.0)
HCT: 31.5 % — ABNORMAL LOW (ref 38.4–49.9)
LYMPH%: 14.8 % (ref 14.0–49.0)
MCH: 33.4 pg (ref 27.2–33.4)
MCHC: 34.4 g/dL (ref 32.0–36.0)
MCV: 97 fL (ref 79.3–98.0)
MONO#: 0.3 10*3/uL (ref 0.1–0.9)
MONO%: 6.6 % (ref 0.0–14.0)
NEUT%: 78.3 % — ABNORMAL HIGH (ref 39.0–75.0)
Platelets: 239 10*3/uL (ref 140–400)
RBC: 3.25 10*6/uL — ABNORMAL LOW (ref 4.20–5.82)
WBC: 4.9 10*3/uL (ref 4.0–10.3)

## 2008-11-01 LAB — LACTATE DEHYDROGENASE: LDH: 129 U/L (ref 94–250)

## 2008-11-02 ENCOUNTER — Ambulatory Visit (HOSPITAL_COMMUNITY): Admission: RE | Admit: 2008-11-02 | Discharge: 2008-11-02 | Payer: Self-pay | Admitting: Hematology and Oncology

## 2009-05-19 ENCOUNTER — Ambulatory Visit: Payer: Self-pay | Admitting: Internal Medicine

## 2009-05-23 ENCOUNTER — Ambulatory Visit (HOSPITAL_COMMUNITY): Admission: RE | Admit: 2009-05-23 | Discharge: 2009-05-23 | Payer: Self-pay | Admitting: Hematology and Oncology

## 2009-05-23 LAB — COMPREHENSIVE METABOLIC PANEL
AST: 16 U/L (ref 0–37)
Alkaline Phosphatase: 65 U/L (ref 39–117)
BUN: 8 mg/dL (ref 6–23)
Calcium: 9 mg/dL (ref 8.4–10.5)
Creatinine, Ser: 1.34 mg/dL (ref 0.40–1.50)

## 2009-05-23 LAB — CBC WITH DIFFERENTIAL/PLATELET
Basophils Absolute: 0 10*3/uL (ref 0.0–0.1)
EOS%: 1.1 % (ref 0.0–7.0)
HCT: 33.9 % — ABNORMAL LOW (ref 38.4–49.9)
HGB: 11.8 g/dL — ABNORMAL LOW (ref 13.0–17.1)
LYMPH%: 21.2 % (ref 14.0–49.0)
MCH: 34 pg — ABNORMAL HIGH (ref 27.2–33.4)
MCV: 97.9 fL (ref 79.3–98.0)
MONO%: 9.2 % (ref 0.0–14.0)
NEUT%: 68.3 % (ref 39.0–75.0)

## 2009-09-09 DIAGNOSIS — G629 Polyneuropathy, unspecified: Secondary | ICD-10-CM

## 2009-09-09 HISTORY — DX: Polyneuropathy, unspecified: G62.9

## 2009-11-16 ENCOUNTER — Ambulatory Visit: Payer: Self-pay | Admitting: Hematology and Oncology

## 2009-11-20 ENCOUNTER — Ambulatory Visit (HOSPITAL_COMMUNITY): Admission: RE | Admit: 2009-11-20 | Discharge: 2009-11-20 | Payer: Self-pay | Admitting: Hematology and Oncology

## 2009-11-20 LAB — CBC WITH DIFFERENTIAL/PLATELET
BASO%: 0.4 % (ref 0.0–2.0)
Basophils Absolute: 0 10*3/uL (ref 0.0–0.1)
EOS%: 1.5 % (ref 0.0–7.0)
HCT: 35.3 % — ABNORMAL LOW (ref 38.4–49.9)
HGB: 12.1 g/dL — ABNORMAL LOW (ref 13.0–17.1)
LYMPH%: 24.6 % (ref 14.0–49.0)
MCH: 34.2 pg — ABNORMAL HIGH (ref 27.2–33.4)
MCHC: 34.4 g/dL (ref 32.0–36.0)
NEUT%: 64.4 % (ref 39.0–75.0)
Platelets: 197 10*3/uL (ref 140–400)

## 2009-11-20 LAB — COMPREHENSIVE METABOLIC PANEL
ALT: 10 U/L (ref 0–53)
AST: 16 U/L (ref 0–37)
BUN: 10 mg/dL (ref 6–23)
CO2: 31 mEq/L (ref 19–32)
Calcium: 8.8 mg/dL (ref 8.4–10.5)
Chloride: 100 mEq/L (ref 96–112)
Creatinine, Ser: 1.74 mg/dL — ABNORMAL HIGH (ref 0.40–1.50)
Total Bilirubin: 0.6 mg/dL (ref 0.3–1.2)

## 2009-11-20 LAB — LACTATE DEHYDROGENASE: LDH: 141 U/L (ref 94–250)

## 2010-09-28 ENCOUNTER — Other Ambulatory Visit: Payer: Self-pay | Admitting: Hematology and Oncology

## 2010-09-28 DIAGNOSIS — C49A0 Gastrointestinal stromal tumor, unspecified site: Secondary | ICD-10-CM

## 2010-09-29 ENCOUNTER — Encounter: Payer: Self-pay | Admitting: Hematology and Oncology

## 2010-09-30 ENCOUNTER — Encounter: Payer: Self-pay | Admitting: Hematology and Oncology

## 2010-11-27 ENCOUNTER — Encounter (HOSPITAL_BASED_OUTPATIENT_CLINIC_OR_DEPARTMENT_OTHER): Payer: PRIVATE HEALTH INSURANCE | Admitting: Hematology and Oncology

## 2010-11-27 ENCOUNTER — Other Ambulatory Visit: Payer: Self-pay | Admitting: Hematology and Oncology

## 2010-11-27 ENCOUNTER — Ambulatory Visit (HOSPITAL_COMMUNITY)
Admission: RE | Admit: 2010-11-27 | Discharge: 2010-11-27 | Disposition: A | Discharge status: Home / Self Care | Payer: No Typology Code available for payment source | Source: Ambulatory Visit | Attending: Hematology and Oncology | Admitting: Hematology and Oncology

## 2010-11-27 ENCOUNTER — Other Ambulatory Visit (HOSPITAL_COMMUNITY): Payer: Self-pay

## 2010-11-27 DIAGNOSIS — I2510 Atherosclerotic heart disease of native coronary artery without angina pectoris: Secondary | ICD-10-CM | POA: Insufficient documentation

## 2010-11-27 DIAGNOSIS — Z9089 Acquired absence of other organs: Secondary | ICD-10-CM | POA: Insufficient documentation

## 2010-11-27 DIAGNOSIS — Q619 Cystic kidney disease, unspecified: Secondary | ICD-10-CM | POA: Insufficient documentation

## 2010-11-27 DIAGNOSIS — N62 Hypertrophy of breast: Secondary | ICD-10-CM | POA: Insufficient documentation

## 2010-11-27 DIAGNOSIS — J8410 Pulmonary fibrosis, unspecified: Secondary | ICD-10-CM | POA: Insufficient documentation

## 2010-11-27 DIAGNOSIS — J438 Other emphysema: Secondary | ICD-10-CM | POA: Insufficient documentation

## 2010-11-27 DIAGNOSIS — R109 Unspecified abdominal pain: Secondary | ICD-10-CM | POA: Insufficient documentation

## 2010-11-27 DIAGNOSIS — J984 Other disorders of lung: Secondary | ICD-10-CM | POA: Insufficient documentation

## 2010-11-27 DIAGNOSIS — C494 Malignant neoplasm of connective and soft tissue of abdomen: Secondary | ICD-10-CM

## 2010-11-27 DIAGNOSIS — I700 Atherosclerosis of aorta: Secondary | ICD-10-CM | POA: Insufficient documentation

## 2010-11-27 DIAGNOSIS — K7689 Other specified diseases of liver: Secondary | ICD-10-CM | POA: Insufficient documentation

## 2010-11-27 DIAGNOSIS — M5137 Other intervertebral disc degeneration, lumbosacral region: Secondary | ICD-10-CM | POA: Insufficient documentation

## 2010-11-27 DIAGNOSIS — C49A0 Gastrointestinal stromal tumor, unspecified site: Secondary | ICD-10-CM

## 2010-11-27 DIAGNOSIS — M51379 Other intervertebral disc degeneration, lumbosacral region without mention of lumbar back pain or lower extremity pain: Secondary | ICD-10-CM | POA: Insufficient documentation

## 2010-11-27 DIAGNOSIS — R5382 Chronic fatigue, unspecified: Secondary | ICD-10-CM

## 2010-11-27 DIAGNOSIS — K5730 Diverticulosis of large intestine without perforation or abscess without bleeding: Secondary | ICD-10-CM | POA: Insufficient documentation

## 2010-11-27 LAB — CMP (CANCER CENTER ONLY)
Albumin: 3.4 g/dL (ref 3.3–5.5)
BUN, Bld: 8 mg/dL (ref 7–22)
CO2: 32 mEq/L (ref 18–33)
Calcium: 9 mg/dL (ref 8.0–10.3)
Chloride: 97 mEq/L — ABNORMAL LOW (ref 98–108)
Creat: 1.3 mg/dl — ABNORMAL HIGH (ref 0.6–1.2)
Glucose, Bld: 96 mg/dL (ref 73–118)
Potassium: 4.1 mEq/L (ref 3.3–4.7)

## 2010-11-27 LAB — CBC WITH DIFFERENTIAL/PLATELET
Basophils Absolute: 0 10*3/uL (ref 0.0–0.1)
Eosinophils Absolute: 0 10*3/uL (ref 0.0–0.5)
HCT: 35.8 % — ABNORMAL LOW (ref 38.4–49.9)
HGB: 12.2 g/dL — ABNORMAL LOW (ref 13.0–17.1)
MCH: 33.6 pg — ABNORMAL HIGH (ref 27.2–33.4)
NEUT#: 3.3 10*3/uL (ref 1.5–6.5)
NEUT%: 69.1 % (ref 39.0–75.0)
RDW: 13.4 % (ref 11.0–14.6)
lymph#: 1 10*3/uL (ref 0.9–3.3)

## 2010-11-27 MED ORDER — IOHEXOL 300 MG/ML  SOLN
100.0000 mL | Freq: Once | INTRAMUSCULAR | Status: AC | PRN
  Administered 2010-11-27: 100 mL via INTRAVENOUS

## 2010-11-30 ENCOUNTER — Encounter (HOSPITAL_BASED_OUTPATIENT_CLINIC_OR_DEPARTMENT_OTHER): Payer: PRIVATE HEALTH INSURANCE | Admitting: Hematology and Oncology

## 2010-11-30 DIAGNOSIS — C494 Malignant neoplasm of connective and soft tissue of abdomen: Secondary | ICD-10-CM

## 2010-11-30 DIAGNOSIS — R5382 Chronic fatigue, unspecified: Secondary | ICD-10-CM

## 2011-01-22 NOTE — Op Note (Signed)
NAME:  Brett Scott, Brett Scott NO.:  [id number]   MEDICAL RECORD NO.:  [id number]          PATIENT TYPE:  INP   LOCATION:  2550                         FACILITY:  [facility]   PHYSICIAN:  Sammuel Hines. Daiva Nakayama, M.D. DATE OF BIRTH:  24-Dec-1940   DATE OF PROCEDURE:  05/21/2007  DATE OF DISCHARGE:                               OPERATIVE REPORT   PREOPERATIVE DIAGNOSIS:  Ventral incisional hernia.   POSTOPERATIVE DIAGNOSIS:  Ventral incisional hernia.   PROCEDURE:  Laparoscopic ventral hernia repair with mesh.   SURGEON:  Sammuel Hines. Daiva Nakayama, M.D.   ASSISTANT:  Dr. Lucia Gaskins.   ANESTHESIA:  General endotracheal.   PROCEDURE:  After informed consent was obtained, the patient was brought  to the operating room and placed in the supine position on the operating  table.  After adequate induction of general anesthesia, the patient's  abdomen was prepped with Betadine and draped in usual sterile manner  including Ioban drape.  Site was chosen on the left mid abdomen for  placement of the [facility] cannula.  This area was infiltrated with 0.25%  Marcaine.  A small incision was made with a 15 blade knife.  This  incision was carried through the subcutaneous tissue bluntly with a  hemostat and [employer] retractors until the fascia of the abdominal wall  was encountered.  Fascia was incised with a 15 blade knife and  the  fascia was then grasped with Kocher clamps and elevated anteriorly.  The  muscle layers were separated bluntly with [employer].  The peritoneum and  posterior fascia were also identified and grasped with Kocher clamps.  The deep layer of fascia and peritoneum was opened sharply with a 15  blade knife, thereby gaining access to the abdominal cavity.  There were  no apparent adhesions in this area. A 0 Vicryl pursestring stitch was  placed in the fascia around this opening.  A Hassan cannula was then  placed through the opening and anchored in place with the previously  placed  Vicryl pursestring stitch.  The abdomen was then insufflated with  carbon dioxide without difficulty.  In the left upper quadrant, a site  was chosen for a 5 mm port.  This area was infiltrated with 0.25%  Marcaine.  A small stab incision was made with a 15 blade knife and a 5  mm port was placed bluntly through this incision in the abdominal cavity  under direct vision.  A site was also chosen on the right upper quadrant  for placement of another 5 mm port.  This area was infiltrated with  0.25% Marcaine.  Small stab incision was made with a 15 blade knife and  a 5 mm port was placed bluntly through this incision into the abdominal  cavity under direct vision.  A harmonic scalpel was then used to take  down some omental adhesions to the upper abdominal wall.  There were  some dense adhesions of small bowel to the lower abdominal incision but  this was well away from the hernia and these were not interfered with in  any way.  Once the upper omental adhesions had been taken down, we could  clearly see the hernia defect in the upper abdomen.  We were able to map  the size of this with a spinal needle and then chose about 4 cm of  overlap for mesh circumferentially around the defect.  We chose a 15 x  20 cm piece of proceed mesh and cut it to fit.  A #1 Novofil stitch was  then placed at equidistant points around the edge of the mesh.  The mesh  was oriented with the blue side towards the anterior abdominal wall.  The mesh was oriented with letters that corresponded to letters written  on the drapes of the abdominal wall.  The mesh was then rolled like a  cigar and placed through the [facility] cannula into the abdominal cavity.  The mesh was then unrolled under direct vision and reoriented.  Eight  small stab incisions were made corresponding to the placement of the  stitches.  A suture passer was then used to bring the tails of each  appropriate stitch through the abdominal wall.  Once all the  stitches  had been brought through the abdominal wall, each of the stitches was  tied down.  This was all done under direct vision.  Once this was  accomplished, the mesh was in good approximation to the anterior  abdominal wall without any wrinkles or creases.  The gaps between the  stitches were filled in with tacks and once this was accomplished again  the mesh appeared to be in very good position.  He abdomen was inspected  and everything appeared to be hemostatic.  The small bowel did not  appear to have any injuries.  At this point, the gas was allowed to  escape and the mesh was observed, as the gas escaped, to continue to be  in good approximation to the abdominal wall.  The ports were then all  removed after the gas was allowed to escape.  The fascial defect at the  [facility] cannula site was closed with previously placed Vicryl pursestring  stitch.  The port site skin incisions were closed with interrupted 4-0  Monocryl subcuticular stitches and Dermabond was applied to all the  suture holes and incisions at this point and then a Kerlix fluff  dressing was applied with an abdominal binder.  The patient tolerated  well.  At the end of the case all needle, sponge and instrument counts  were correct.  The patient was then awakened and taken to recovery in  stable condition.      Sammuel Hines. Daiva Nakayama, M.D.  Electronically Signed     PST/MEDQ  D:  05/21/2007  T:  05/21/2007  Job:  [id number]

## 2011-01-25 NOTE — Op Note (Signed)
NAME:  Brett Scott, Brett Scott NO.:  [id number]   MEDICAL RECORD NO.:  [id number]          PATIENT TYPE:  AMB   LOCATION:  SDS                          FACILITY:  [facility]   PHYSICIAN:  Metta Clines. Supple, M.D.  DATE OF BIRTH:  05/26/1941   DATE OF PROCEDURE:  12/11/2006  DATE OF DISCHARGE:                               OPERATIVE REPORT   PREOPERATIVE DIAGNOSES:  1. Chronic left shoulder impingement syndrome.  2. Left shoulder symptomatic acromioclavicular joint arthrosis.   POSTOPERATIVE DIAGNOSES:  1. Left shoulder diffuse capsulitis.  2. Left shoulder chronic impingement syndrome.  3. Left shoulder acromioclavicular joint arthrosis.  4. Partial articulated rotator cuff tear.  5. Degenerative labral tear.  6. Synovitis.   PROCEDURE:  1. Left shoulder examination under anesthesia.  2. Left shoulder manipulation under anesthesia.  3. Left shoulder diagnostic arthroscopy.  4. Lysis of adhesions.  5. Debridement of degenerative labral tear.  6. Debridement of partial articulated rotator cuff tear.  7. Arthroscopic subacromial decompression and bursectomy.  8. Arthroscopic distal clavicle resection.   SURGEON:  Metta Clines. Supple, M.D.   Terrence DupontOlivia Mackie A. Shuford, P.A.-C.   ANESTHESIA:  General endotracheal, as well as interscalene block.   ESTIMATED BLOOD LOSS:  Minimal.   DRAINS:  None.   HISTORY:  Brett Scott is a 70 year old gentleman who has had chronic left  shoulder pain with weakness and restrictions in mobility and  progressively increasingly functional limitations.  His examination  shows a severely positive impingement sign, as well as marked  restrictions in mobility and strength.  Plain radiographs were obtained,  showing evidence for [facility] joint arthropathy.  An MRI scan shows evidence  for rotator cuff tendinosis, but no obvious discrete full thickness  rotator cuff tear.  Also confirms advanced AC joint arthropathy.  Due to  his ongoing pain and  functional limitations, he is brought to the  operating room at this time for left shoulder arthroscopy as described  below.   I preoperatively counseled Brett Scott on treatment options, as well as  risks versus benefits.  Possible surgical complications, including  infection, neurovascular injury, persistence of any loss of motion,  anesthetic complications, potential for recurrent rotator cuff injury,  and possible need for additional surgery were all reviewed.  He  understands and accepts and agrees with our planned procedure.   PROCEDURE IN DETAIL:  After undergoing routine preop evaluation, the  patient received prophylactic antibiotics.  An interscalene block was  established in the holding area by the Anesthesia department.  Placed  supine on the operating table and underwent induction of a general  endotracheal anesthesia.  Turned to the right lateral decubitus position  on a beanbag and appropriately padded and protected.  The left shoulder  examination under anesthesia revealed some moderate restriction in  mobility with approximately 120 degrees of forward elevation.  Manipulation was performed with palpable and audible release of  adhesions and ultimately achieved 170 degrees of forward elevation, 160  of abduction, and 85 degrees of internal and external rotation.  The  left arm was then suspended at  70 degrees of abduction with 10 pounds of  traction.  The left shoulder girdle region was sterilely prepped and  draped in standard fashion.  A posterior portal was then established  into the glenohumeral joint and an anterior portal established under  direct visualization.  The glenohumeral articular surfaces were in good  condition.  Large hemarthrosis was evacuated.  There was noted to be  diffuse synovitis and multiple adhesions, which were divided and excised  and synovectomy was performed and then the Arthrex wand was used to  obtain hemostasis.  There was degenerative  tearing at the superior half  of the labrum extending into the root of the biceps consistent with a  type III SLAP lesion.  These areas were debrided with shaver.  I would  estimate that less than 10% thickness of the biceps root was involved.  The biceps root was otherwise well attached and the biceps tendon was of  good normal caliber and showed good stability.  The rotator cuff showed  a partial articular rotator cuff tear involving the distal supraspinatus  and I would estimate that approximately 15% to 20% of the thickness of  the tendon was damaged and torn and these areas were all debrided back  to healthy appearing tissue.  Final hemostasis was then obtained.  We  passed a tag suture of 0 PDS at the level of the rotator cuff  attenuation for visualization on the bursal side.  At this point, all  fluid and instruments had been removed from the glenohumeral joint.  The  arm was then dropped down to 30 degrees of abduction with the  arthroscope introduced in the subacromial space in the posterior portal  and direct lateral portal established in the subacromial space.  Abundant proliferative bursal tissue was encountered.  Multiple  adhesions were divided and excised with a combination of the shaver and  the Arthrex wand.  The wand was then used to remove the periosteum from  the undersurface of the anterior half of the acromion and then a  subacromial decompression was performed with a bur creating a type I  morphology.  Portal was then established to gain entry of the distal  clavicle.  The [facility] joint was markedly degenerative with prominent  inferiorly projecting osteophytes.  A bur was then used to perform a  distal clavicle resection with care taken to make sure the entire  circumference of the distal clavicle could be visualized to ensure  adequate removal of bone.  We then completed the subacromial bursectomy. We carefully inspected the bursal surface of the rotator cuff,   particularly in the area surrounding the tag suture.  This was carefully  probed and the fibers were intact and showed good quality tissue.  Thus,  no rotator cuff repair was indicated or necessary.  Final hemostasis was  obtained.  Fluid and instruments were removed.  The portals were closed  with Monocryl and Steri-Strips.  Bulky dry dressing was then taped about  the left shoulder.  The left arm was placed in a sling immobilizer.  The  patient was supine, extubated, and taken to the recovery room in stable  condition.   The plan is for overnight observation, follow sleep apnea protocol.  Discharged in the morning.  Follow up in the office in a week to 10 days  for wound check and beginning formal physical therapy.      Metta Clines. Supple, M.D.  Electronically Signed     KMS/MEDQ  D:  12/11/2006  T:  12/11/2006  Job:  [id number]

## 2011-01-25 NOTE — Discharge Summary (Signed)
NAME:  ZAKARIE, STURDIVANT NO.:  [id number]   MEDICAL RECORD NO.:  [id number]          PATIENT TYPE:  INP   LOCATION:  5713                         FACILITY:  [facility]   PHYSICIAN:  Sammuel Hines. Daiva Nakayama, M.D. DATE OF BIRTH:  01/11/1941   DATE OF ADMISSION:  02/14/2005  DATE OF DISCHARGE:  02/21/2005                                 DISCHARGE SUMMARY   Mr. Bogacki is a 70 year old gentleman who had a stomach mass and gallstones  and some upper abdominal discomfort.  He underwent an open cholecystectomy  and partial gastrectomy to remove a GI stromal tumor from the anterior wall  of the stomach.  He tolerated this well.  Postoperatively his NG tube was  left in for a couple days.  His NG was clamped and then discontinued on June  12 and he was started on Reglan.  He seemed to do well with this.  He was  started on clears on June 13 and gradually improved to the point on June 15  he was tolerating a diet, doing well, and was discharged home.   DISCHARGE MEDICATIONS:  He was to resume his home medications and he was  given a prescription for Vicodin for pain.   ACTIVITY:  No heavy lifting.   CONDITION ON DISCHARGE:  Stable.   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   FOLLOW-UP:  With Dr. Marlou Starks in the next couple weeks.  He is discharged home.      Sammuel Hines. Daiva Nakayama, M.D.  Electronically Signed     PST/MEDQ  D:  05/09/2005  T:  05/09/2005  Job:  [id number]

## 2011-01-25 NOTE — Cardiovascular Report (Signed)
[place]  Patient:    Brett Scott, Brett Scott                          MRN: [id number] Proc. Date: 01/16/01 Attending:  Gordy Clement, M.D. CC:         Henrine Screws, M.D.  Peripheral Catherization Lab   Cardiac Catheterization  DIAGNOSES: 1. Abdominal aortic aneurysm. 2. Bilateral common iliac artery aneurysms.  PROCEDURES:  Midabdominal aortogram with bilateral lower extremity runoff arteriography.  ACCESS:  Right common femoral artery 5 French sheath.  CONTRAST:  Visipaque 140 mL.  COMPLICATIONS:  None apparent.  CLINICAL NOTE:  This is a 70 year old male, who was referred for evaluation of a 5.5 cm abdominal aortic aneurysm.  CT scan verified these findings.  Also, bilateral iliac enlargement was noted.  He is brought to the catheterization lab at this time for diagnostic arteriography.  DESCRIPTION OF PROCEDURE:  The patient was brought to the catheterization lab in stable condition.  He was placed in the supine position. Informed consent was obtained.  Both groins were prepped and draped in a sterile fashion. Intravenous 2 mg of Nubain, 2 mg of Versed administered.  The right groin, skin and subcutaneous tissue was instilled with 1% Xylocaine.  A needle was easily introduced to the right common femoral artery.  A 0.35 J wire passed through the needle into the midabdominal aorta.  A 5 French sheath was advanced over the guidewire into the right common femoral artery.  The dilator was removed and the sheath flushed with heparin saline solution.  A standard pigtail catheter was advanced over the guidewire to the suprarenal aorta.  Standard midabdominal aortogram obtained.  This revealed single left renal artery with mild stenosis estimated to be 20-30%.  The right renal artery revealed early bifurcation.  No significant right renal stenosis was noted. The kidneys bilaterally revealed normal outline.  The infrarenal aorta revealed  a short aortic neck estimated to be approximately 1-2 cm.  A abdominal aortic aneurysm was evident consistent with 5.5 cm.  Bilateral common iliac aneurysms were present extending down to the iliac bifurcation bilaterally.  A lateral aortogram then obtained.  This revealed widely patent celiac and SMA. The infrarenal aorta revealed moderate anterior angulation and tortuosity.  The pigtail catheter was brought down to the aortic bifurcation and standard AP pelvic arteriogram obtained.  This again verified bilateral common iliac enlargement with a saccular appearing aneurysm of the right common iliac artery.  The left common iliac artery was diffusely enlargement.  Aneurysmal involvement went down to, but did not involve the internal and external iliac arteries bilaterally.  The external iliac arteries bilaterally were widely patent to the common femoral level.  The right lower extremity revealed normal common femoral artery.  The right profunda femoris and superficial femoral arteries were widely patent.  There was continuous flow to the right popliteal artery, which was normal.  Right lower extremity tibial runoff revealed patent tibial vessels.  Left lower extremity revealed patent common femoral artery, normal left superficial femoral and profunda femoris arteries.  There was continuous flow to the left popliteal artery.  There was a moderate 40-50% stenosis in the left popliteal artery at the knee joint.  The left lower extremity runoff revealed dominant posterior tibial and peroneal with small anterior tibial artery.  This completed the arteriogram procedure.  The patient tolerated this without complication.  The guidewire was reinserted and the pigtail catheter and  guidewire removed.  The right femoral sheath was removed in the holding area without apparent complication.  FINAL IMPRESSION: 1. Infrarenal abdominal aortic aneurysm. 2. Bilateral common iliac artery  aneurysms.  DISPOSITION:  These results have been discussed with the patient and his wife. The patient is scheduled for a Cardiolite evaluation on May 20.  Following this, he will be scheduled for surgery. DD:  01/16/01 TD:  01/17/01 Job: [id number]

## 2011-01-25 NOTE — Op Note (Signed)
NAME:  Brett Scott, Brett Scott NO.:  [id number]   MEDICAL RECORD NO.:  [id number]          PATIENT TYPE:  INP   LOCATION:  2550                         FACILITY:  [facility]   PHYSICIAN:  Sammuel Hines. Daiva Nakayama, M.D. DATE OF BIRTH:  1940/10/13   DATE OF PROCEDURE:  02/14/2005  DATE OF DISCHARGE:                                 OPERATIVE REPORT   PREOPERATIVE DIAGNOSES:  Gallstones and stomach mass,   POSTOPERATIVE DIAGNOSES:  Gallstones and stomach mass.   PROCEDURE:  Exploratory laparotomy, lysis of adhesions, cholecystectomy and  partial gastrectomy.   SURGEON:  Dr. Marlou Starks.   ASSISTANT:  Dr. Rise Patience.   ANESTHESIA:  General endotracheal.   PROCEDURE:  After informed consent was obtained, the patient was brought to  the operating room and placed in a supine position on the operating room  table.  After adequate induction of general anesthesia, the patient's  abdomen was prepped with Betadine and draped in the usual sterile manner. An  upper midline incision was made with a 10 blade knife through his old  incision. This incision was carried down through the skin and subcutaneous  tissue sharply with the electrocautery and the linea alba was identified.  The linea alba was also incised with the electrocautery. The preperitoneal  space was probed bluntly with a hemostat until the peritoneum was opened and  access was gained to the abdominal cavity. The rest of the incision was then  opened under direct vision. There were omental adhesions to the anterior  abdominal wall and these were taken down sharply with the electrocautery.  Once this was accomplished, the upper abdomen was free. Attention was first  turned to the gallbladder. There were some small omental adhesions to the  body of the gallbladder that were taken down sharply with the  electrocautery. A Balfour retractor was deployed. The dome of the  gallbladder was grasped with a Kelly clamp and the gallbladder was  dissected  away from the liver bed sharply with the electrocautery in a top-down  fashion. Once near the neck of the gallbladder, the cystic artery was  identified and dissected bluntly in a circumferential manner. A two clips  were placed on the stay side of the cystic artery and the artery was  divided. Also the same area the cystic duct was identified and also  dissected bluntly in a circumferential manner. A small ductotomy was made at  the gallbladder neck but the cystic duct was too small to cannulate with the  Tart catheter. Two clips were therefore placed on the stay side of the  cystic duct and duct was divided. The gallbladder was removed from the  patient. The liver bed was fulgurated with the electrocautery until the area  was completely hemostatic. Next, the attention was turned to the stomach.  The stomach was easy to identify and the lower portion of the stomach was  grasped with a Babcock and the stomach was retracted down into the wound.  An approximately 3-4 cm mass coming off the anterior wall of the stomach was  readily identified.  It was  well circumscribed in the wall of the stomach. A  GIA 75 stapler was placed across the wall of the stomach beneath the mass in  question so that there was a margin, clamped and then fired.  It took two  firings of the GIA 75 stapler to get all the way across the stomach wall.  This effectively removed part of the anterior wall of the stomach with the  tumor. The tumor was then sent to pathology for further evaluation. The  staple line across the anterior wall of the stomach was then reinforced with  interrupted 3-0 silk Lembert stitches to dunk the staple line. Once this was  accomplished, the area appeared to be completely hemostatic. The NG tube was  identified and pulled back and then advanced into their proper position. The  NG tube was functioning well. The rest of the abdomen that could be seen in  the upper abdomen was  inspected. No other abnormalities were noted. The  liver appeared to be normal. The abdomen was then irrigated with copious  amounts of saline. The liver bed was inspected again where the gallbladder  was and it continued to be hemostatic. At this point, the fascia of the  anterior abdominal wall was closed with two running #1 PDS sutures. The  subcutaneous tissue was irrigated with copious amounts of saline and  Betadine and the skin was closed with staples. Sterile dressings were  applied. The patient tolerated the procedure well.  At the end of the case,  all needle, sponge and instruments counts were correct. The patient was then  awakened and taken to the recovery room in stable condition.       PST/MEDQ  D:  02/14/2005  T:  02/14/2005  Job:  [id number]

## 2011-01-25 NOTE — Op Note (Signed)
[place]  Patient:    STIRLING, ORTON                        MRN: [id number] Proc. Date: 02/19/01 Adm. Date:  [id number] Attending:  Lavonna Monarch CC:         Henrine Screws, M.D.   Operative Report  PREOPERATIVE DIAGNOSES: 1. A 5 cm infrarenal abdominal aortic aneurysm. 2. Bilateral common iliac artery aneurysms.  POSTOPERATIVE DIAGNOSES: 1. A 5 cm infrarenal abdominal aortic aneurysm. 2. Bilateral common iliac artery aneurysms.  PROCEDURE:  Repair of infrarenal abdominal aortic aneurysm and bilateral common iliac aneurysms with aortobi-iliac graft.  SURGEON:  Gordy Clement, M.D.  ASSISTANT:  Earnstine Regal, P.A.  ANESTHESIA:  General endotracheal.  ANESTHESIOLOGIST:  Finis Bud, M.D.  CLINICAL NOTE:  This is a 70 year old male who was found to have a pulsatile abdominal mass.  Workup for this revealed a 5 cm infrarenal abdominal aortic aneurysm along with bilateral common iliac aneurysms.  CT scan and arteriography were carried out preoperatively.  The patient was not felt to be a candidate for an endovascular graft due to his young age and also significant involvement of his iliac arteries with aneurysmal disease. Brought to the operating room at this time for elective repair.  Risks and benefits of the operative procedure explained in detail, including the potential risks of MI, CVA, renal failure, limb loss, bleeding, infection, transfusion, and death.  DESCRIPTION OF PROCEDURE:  Patient brought to the operating room in stable condition.  Preoperative arterial line and Swan-Ganz catheter inserted. Placed under general endotracheal anesthesia.  Foley catheter inserted.  In the supine position, the abdomen and both legs prepped and draped in a sterile fashion.  Midline abdominal skin incision made from xiphoid to pubis.  Incision extended deeply through the subcutaneous tissue.  Dissection carried down to the  linea alba.  The fascia incised in the midline.  Full laparotomy evaluation carried out.  Stomach and duodenum were unremarkable.  Liver, gallbladder, bile duct, pancreas were normal.  Small bowel and large bowel revealed no masses.  Retroperitoneum verified the infrarenal abdominal aortic aneurysm consistent with 5 cm in size and bilateral common iliac aneurysm disease.  Small bowel was retracted to the right, transverse colon brought superiorly. The retroperitoneum incised over the neck of the aneurysm sac.  The origins of the renal arteries bilaterally were clearly identified.  There was an adequate infrarenal neck for clamp placement.  The origin of the inferior mesenteric artery was dissected free and encircled with a vessel loop.  Distal dissection carried down along the aneurysm sac to the aortic bifurcation.  The retroperitoneum incised along the course of the right common iliac artery, down to the right common iliac bifurcation.  The aneurysm disease involved the right common iliac bifurcation, the internal and external iliac arteries were encircled with vessel loops.  The right ureter reflected inferiorly.  The reflection of the sigmoid mesocolon was incised laterally.  The sigmoid mesocolon mobilized medially.  The left ureter reflected laterally.  The left common iliac bifurcation exposed.  The aneurysm sac extended down to but did not involve the left common iliac bifurcation.  The left internal and external iliac arteries encircled with vessel loops.  Patient administered 5000 units of heparin intravenously, 25 g of mannitol intravenously.  Adequate circulation time permitted.  The infrarenal aorta controlled with an aortic DeBakey clamp.  The internal and external iliac arteries bilaterally controlled  with coarctation clamps.  The aneurysm sac opened longitudinally.  A small amount of laminated thrombus removed.  No significant backbleeding lumbar vessels were  identified.  A 16 x 8 Hemashield bifurcated graft was chosen.  This was trimmed to appropriate length and anastomosed to the infrarenal neck of the aneurysm with running 3-0 Prolene suture.  At completion of the proximal anastomosis, the graft was flushed and each limb of the graft controlled with a Fogarty clamp.  The left limb of the graft was tunneled behind the sigmoid mesocolon. Approximated to the left common iliac bifurcation.  The left common iliac artery divided at its bifurcation.  The left limb of the graft divided and anastomosed end-to-end to the left common iliac bifurcation, perfusing both the left internal and external iliac arteries.  This was carried out with a running 4-0 Prolene suture.  At completion of the anastomosis, the left limb of the graft flushed.  Clamps were removed and the left leg reperfused without difficulty.  Attention was then placed on the right leg.  The right internal iliac artery was ligated with a 0 silk tie.  The right external iliac artery was divided transversely just beyond its origin.  The right limb of the graft brought down to the right external iliac artery and anastomosed end-to-end with running 4-0 Prolene suture.  After flushing, clamps were removed and the right leg reperfused.  Adequate hemostasis was obtained.  Sponge and instrument counts were correct. The patient administered 50 mg protamine intravenously.  The retroperitoneum was reapproximated over the left limb of the graft with running 3-0 Vicryl suture.  The aneurysm sac was closed over the graft with a running 2-0 Vicryl suture.  The retroperitoneum reapproximated over the aneurysm sac with running 3-0 Vicryl suture.  The abdomen was examined to be sure there were no retained instruments or sponges.  The midline fascia was closed with running #1 PDS suture.  Staples applied to the skin.  A sterile dressing was applied.  The patient was transferred to the surgical  intensive care unit in stable  condition.  Legs well-perfused.  No apparent intraoperative complications.DD: 02/19/01 TD:  02/20/01 Job: [id number]

## 2011-01-25 NOTE — Op Note (Signed)
NAME:  Brett Scott, Brett Scott NO.:  [id number]   MEDICAL RECORD NO.:  [id number]                   PATIENT TYPE:  AMB   LOCATION:  [place]                                  FACILITY:  [facility]   PHYSICIAN:  Fenton Malling. Lucia Gaskins, M.D.               DATE OF BIRTH:  [birth year]   DATE OF PROCEDURE:  10/26/2002  DATE OF DISCHARGE:                                 OPERATIVE REPORT   PREOPERATIVE DIAGNOSIS:  Left inguinal hernia.   POSTOPERATIVE DIAGNOSIS:  Direct left inguinal hernia   PROCEDURE:  Open left inguinal hernia repair with mesh.   SURGEON:  Fenton Malling. Lucia Gaskins, M.D.   FIRST ASSISTANT:  None.   ANESTHESIA:  MAC with about 30 cc of local.   COMPLICATIONS:  None.   INDICATIONS FOR PROCEDURE:  The patient is a 70 year old white male who has  a symptomatic left inguinal hernia and comes for repair of this hernia.  The  patient is also on CPAP and with plans of probable overnight observation in  that he has a history of being on CPAP for chronic lung disease.   The indications and potential complications were explained to the patient.  The potential complication included but were not limited to infection,  bleeding, nerve injury, and recurrence of hernia.   PROCEDURE NOTE:  The patient was placed in the supine position with his left  groin shaved and prepped.  I had marked his left side preoperatively.  I  used a mixture of 2% Xylocaine with epinephrine with 0.25% Marcaine with  epinephrine mixed 10:1 with sodium  bicarb.  I then infiltrated this into  the left groin.  I went through the skin layer, fascial layers and deep  layers.  Sharp dissection was carried down to the external oblique fascia  which was opened.  The cord structures were encircled with a Penrose drain.  The patient had a moderately-large indirect inguinal hernia which had a neck  width of about 2.5 cm and protruded probably 6-7 cm, an was a lateral direct  inguinal hernia.  I saw no evidence of  an indirect inguinal hernia  component.   I then placed a piece of Atrium mesh in the inguinal floor and sewed this  medially to the pubic tubercle, inferiorly to the shelving edge of the  inguinal ligament, superiorly to the transversalis fascia.  I cut a keyhole  for the internal ring, and then sewed this around the backside of the mesh.   I then irrigated out the wound. returned the course which was near normal  location, closed the external oblique fascia with interrupted 3-0 Vicryl  sutures, closed the subcutaneous tissues with 3-0 Vicryl sutures, and the  skin with a 5-0 Vicryl subcuticular suture.   The patient tolerated the procedure well and was transported to the recovery  room in good condition.  Because of his history of lung  disease and on CPAP,  I will keep him overnight for observation.                                               Fenton Malling. Lucia Gaskins, M.D.    DHN/MEDQ  D:  10/26/2002  T:  10/26/2002  Job:  [id number]   cc:   Dwaine Deter, M.D.  [address]  [place] [address]  Fax: [phone]

## 2011-01-25 NOTE — H&P (Signed)
[place]. [facility]  Patient:    Brett Scott, Brett Scott                       MRN: [id number] Adm. Date:  02/19/01 Attending:  Gordy Clement, M.D. Dictator:   Sherrilyn Rist, P.A.-C. CC:         Henrine Screws, M.D.   History and Physical  DATE OF BIRTH:  [birth year]  CHIEF COMPLAINT:  Abdominal aortic aneurysm.  HISTORY OF PRESENT ILLNESS:  A 70 year old white male referred by Dr. Marcellus Scott for evaluation of AAA, recently discovered on a routine physical exam. During this exam, a tender area of palpation was noticed.  A follow up CT showed a 5.5 cm AAA, and bilateral common iliac artery aneurysm of about 4 cm on the left and 3 cm on the right.  He underwent an arteriogram on Jan 16, 2001, which confirmed the presence of this aneurysm.  The patient is not a candidate for stent procedure, therefore Dr. Amedeo Plenty recommended to proceed with open repair, which is scheduled for February 19, 2001.  Other than mild abdominal pain on palpation around the midepigastric area, he denies any nausea, vomiting, constipation, hematochezia, or hematemesis.  He does have some back pain with strenuous exercise.  No claudication, symptoms of peripheral edema.   No dysuria or hematuria.  No GERD symptoms, shortness of breath, or dyspnea on exertion.  PAST MEDICAL HISTORY:  Questionable history of carotid artery disease, AAA, hypercholesterolemia, GERD symptoms, previous history of BPH, ejection fraction 66%, history of tobacco abuse, previous history of left amaurosis fugax in the year 2000, history of polyarthralgia secondary to ruptured disk, history of right upper lobe pneumonia in January 2000, DJD of the knees.  PAST SURGICAL HISTORY:  Status post abdominal aortogram on Jan 16, 2001, Dr. Amedeo Plenty.  MEDICATIONS:  Plavix discontinued on February 12, 2001, Zocor 80 mg p.o. q.d., Prevacid 30 mg q.d., terazosin 2 mg p.o. q.d.  ALLERGIES:  TETANUS TOXOID AND CODEINE.  REVIEW  OF SYSTEMS:  See HPI and past medical history for significant positives.  No diabetes, kidney disease, TIAs, CVA, or cardiac disease.  He does have a history of amaurosis fugax.  FAMILY HISTORY:  Mother and father died at 70 and 62 respectively, unknown causes of death.  SOCIAL HISTORY:  Married, two children.  He is a Secondary school teacher at [employer] and he smokes one-pack-a-day of cigarettes since 1960.  He denies any alcohol intake.  PHYSICAL EXAMINATION:  GENERAL:  A well-developed and well-nourished 70 year old white male in no acute distress.  Alert and oriented x 3.  VITAL SIGNS:  Blood pressure 115/60, pulse 64, respirations 18.  HEENT:  Normocephalic and atraumatic.  PERRLA, EOMI.  Funduscopic exam within normal limits.  NECK:  Supple, no JVD, bruits, or lymphadenopathy.  CHEST:  Symmetrical on inspiration.  LUNGS:  Have no wheezes, diffuse rhonchi, no rales or lymphadenopathy.  CARDIOVASCULAR:  Regular rate and rhythm.  No murmurs, rubs, or gallops.  ABDOMEN:  Soft, nontender.  Bowel sounds x 4.  There is a palpable pulse at the mass consistent with the aneurysm, but no bruits are audible.  GU AND RECTAL:  Deferred.  EXTREMITIES:  No clubbing, cyanosis, or edema.  No ulcerations.  Warm temperature.  Peripheral pulses, carotids, and femoral 2+ bilaterally. Popliteal 1+ bilaterally.  Dorsalis pedis 1+ on the left and 2+ on the right. Tibialis 1+ on the left and 2+ on the right.  NEUROLOGIC:  Nonfocal.  Gait steady.  DTRs 2+ bilaterally.  Muscle strength 5/5.  ASSESSMENT AND PLAN:  Abdominal aortic aneurysm for repair and graft of the abdominal aortic aneurysm on February 19, 2001, by Dr. Amedeo Plenty.  Dr. Amedeo Plenty has seen and evaluated this patient prior to the admission, and has explained the risks and benefits of the procedure, and the patient has agreed to continue. DD:  02/17/01 TD:  02/17/01 Job: [id number]

## 2011-01-25 NOTE — Discharge Summary (Signed)
[place]. [facility]  Patient:    Brett Scott, Brett Scott                        MRN: [id number] Adm. Date:  [id number] Disc. Date: 02/26/01 Attending:  Lavonna Monarch Dictator:   Ollen Bowl, P.A.C. CC:         Gordy Clement, M.D.  Henrine Screws, M.D.   Discharge Summary  DATE OF BIRTH:  [birth year]  ADMISSION DIAGNOSES: 1. Abdominal aortic aneurysm. 2. Hypercholesterolemia. 3. Gastroesophageal reflux disease. 4. History of tobacco abuse.  DISCHARGE DIAGNOSIS:  Abdominal aortic aneurysm.  PROCEDURE:  Aortobi-iliac bypass graft.  Postoperative ankle brachial indexes.  HOSPITAL COURSE:  Mr. Mackie was admitted to [facility] on February 19, 2001, secondary to abdominal aortic aneurysm.  On this date, Dr. Amedeo Plenty performed an aortobi-iliac bypass graft without complications.  The patient was extubated without difficulty.  His hospital course and postoperative course remained uneventful.  His diet was advanced as tolerated and he mobilized as he tolerated.  He was subsequently discharged to home in stable condition on February 26, 2001.  DISCHARGE MEDICATIONS: 1. Tylox one to two tablets every four to six hours as needed for pain. 2. Nicoderm patch one patch daily. 3. Zocor 80 mg one tablet daily. 4. Hytrin 2 mg one tablet daily. 5. Prevacid 30 mg one tablet daily.  DISCHARGE ACTIVITY:  The patient is told to avoid driving, strenuous activity, and lifting heavy objects.  DIET:  Low fat, low salt.  WOUND CARE:  The patient is told he may shower and clean his incisions with soap and water.  DISPOSITION:  Home.  FOLLOW-UP:  The patient was told to report to [facility] on Thursday, June 27, at 10 a.m. for staple removal.  He was told to follow up with Dr. Amedeo Plenty at the [facility] on Monday, July 8, at 9:30 a.m.  The patient was further instructed on the importance of not smoking while using Nicoderm patch. DD:  02/24/01 TD:   02/24/01 Job: [id number]

## 2011-06-21 LAB — DIFFERENTIAL
Basophils Absolute: 0
Basophils Relative: 0
Basophils Relative: 0
Eosinophils Relative: 1
Lymphocytes Relative: 20
Monocytes Absolute: 0.5
Monocytes Absolute: 0.5
Monocytes Relative: 8
Monocytes Relative: 8
Neutro Abs: 4.1
Neutro Abs: 5.2
Neutrophils Relative %: 70

## 2011-06-21 LAB — BASIC METABOLIC PANEL
CO2: 32
Calcium: 8.6
Calcium: 9
Chloride: 106
Creatinine, Ser: 1.18
GFR calc Af Amer: >60
GFR calc Af Amer: >60
GFR calc non Af Amer: >60
Glucose, Bld: 116 — ABNORMAL HIGH
Potassium: 4.8
Sodium: 135
Sodium: 142

## 2011-06-21 LAB — CBC
HCT: 38 — ABNORMAL LOW
Hemoglobin: 12.8 — ABNORMAL LOW
Hemoglobin: 14.1
MCHC: 33.6
RBC: 3.83 — ABNORMAL LOW
RBC: 4.14 — ABNORMAL LOW
RDW: 13.3

## 2011-09-27 ENCOUNTER — Other Ambulatory Visit: Payer: Self-pay | Admitting: Hematology and Oncology

## 2011-09-27 DIAGNOSIS — C49A2 Gastrointestinal stromal tumor of stomach: Secondary | ICD-10-CM

## 2011-10-12 ENCOUNTER — Telehealth: Payer: Self-pay | Admitting: Hematology and Oncology

## 2011-10-12 NOTE — Telephone Encounter (Signed)
S/w pt re appts for march including ct. Per pt request appts mailed.

## 2011-11-27 ENCOUNTER — Other Ambulatory Visit (HOSPITAL_COMMUNITY): Payer: No Typology Code available for payment source

## 2011-11-27 ENCOUNTER — Telehealth: Payer: Self-pay | Admitting: Hematology and Oncology

## 2011-11-27 ENCOUNTER — Other Ambulatory Visit: Payer: Self-pay | Admitting: *Deleted

## 2011-11-27 ENCOUNTER — Other Ambulatory Visit: Payer: No Typology Code available for payment source

## 2011-11-27 NOTE — Telephone Encounter (Signed)
Returned pt's call re cancelling his appts for 3/20. lmonvm for pt informing him that we did get his message. Pt to r/s his ct and once ct is rescheduled we will r/s lab and call pt w/new appt for LO. thu aware.

## 2011-11-29 ENCOUNTER — Ambulatory Visit: Payer: No Typology Code available for payment source | Admitting: Hematology and Oncology

## 2011-12-02 ENCOUNTER — Telehealth: Payer: Self-pay | Admitting: Hematology and Oncology

## 2011-12-02 NOTE — Telephone Encounter (Signed)
S/w pt re appts for 4/2 lb/ct and 4/5 LO.

## 2011-12-10 ENCOUNTER — Other Ambulatory Visit (HOSPITAL_BASED_OUTPATIENT_CLINIC_OR_DEPARTMENT_OTHER): Payer: No Typology Code available for payment source | Admitting: Lab

## 2011-12-10 ENCOUNTER — Ambulatory Visit (HOSPITAL_COMMUNITY)
Admission: RE | Admit: 2011-12-10 | Discharge: 2011-12-10 | Disposition: A | Discharge status: Home / Self Care | Payer: Medicare Other | Source: Ambulatory Visit | Attending: Hematology and Oncology | Admitting: Hematology and Oncology

## 2011-12-10 DIAGNOSIS — D4819 Other specified neoplasm of uncertain behavior of connective and other soft tissue: Secondary | ICD-10-CM | POA: Insufficient documentation

## 2011-12-10 DIAGNOSIS — J438 Other emphysema: Secondary | ICD-10-CM | POA: Insufficient documentation

## 2011-12-10 DIAGNOSIS — C494 Malignant neoplasm of connective and soft tissue of abdomen: Secondary | ICD-10-CM

## 2011-12-10 DIAGNOSIS — K439 Ventral hernia without obstruction or gangrene: Secondary | ICD-10-CM | POA: Insufficient documentation

## 2011-12-10 DIAGNOSIS — C49A2 Gastrointestinal stromal tumor of stomach: Secondary | ICD-10-CM

## 2011-12-10 DIAGNOSIS — Z903 Acquired absence of stomach [part of]: Secondary | ICD-10-CM | POA: Insufficient documentation

## 2011-12-10 DIAGNOSIS — K7689 Other specified diseases of liver: Secondary | ICD-10-CM | POA: Insufficient documentation

## 2011-12-10 DIAGNOSIS — R110 Nausea: Secondary | ICD-10-CM | POA: Insufficient documentation

## 2011-12-10 DIAGNOSIS — K5900 Constipation, unspecified: Secondary | ICD-10-CM | POA: Insufficient documentation

## 2011-12-10 DIAGNOSIS — D481 Neoplasm of uncertain behavior of connective and other soft tissue: Secondary | ICD-10-CM | POA: Insufficient documentation

## 2011-12-10 DIAGNOSIS — N281 Cyst of kidney, acquired: Secondary | ICD-10-CM | POA: Insufficient documentation

## 2011-12-10 DIAGNOSIS — R197 Diarrhea, unspecified: Secondary | ICD-10-CM | POA: Insufficient documentation

## 2011-12-10 DIAGNOSIS — R1084 Generalized abdominal pain: Secondary | ICD-10-CM | POA: Insufficient documentation

## 2011-12-10 LAB — CMP (CANCER CENTER ONLY)
AST: 20 U/L (ref 11–38)
BUN, Bld: 14 mg/dL (ref 7–22)
CO2: 30 mEq/L (ref 18–33)
Calcium: 8.3 mg/dL (ref 8.0–10.3)
Chloride: 97 mEq/L — ABNORMAL LOW (ref 98–108)
Creat: 1.5 mg/dl — ABNORMAL HIGH (ref 0.6–1.2)
Total Bilirubin: 0.5 mg/dl (ref 0.20–1.60)

## 2011-12-10 MED ORDER — IOHEXOL 300 MG/ML  SOLN
100.0000 mL | Freq: Once | INTRAMUSCULAR | Status: AC | PRN
  Administered 2011-12-10: 100 mL via INTRAVENOUS

## 2011-12-12 ENCOUNTER — Telehealth: Payer: Self-pay | Admitting: Hematology and Oncology

## 2011-12-12 NOTE — Telephone Encounter (Signed)
Pt called to r/s 4/5 appt. Message sent to nurse to address. Pt aware.

## 2011-12-13 ENCOUNTER — Telehealth: Payer: Self-pay | Admitting: Hematology and Oncology

## 2011-12-13 ENCOUNTER — Ambulatory Visit: Payer: No Typology Code available for payment source | Admitting: Hematology and Oncology

## 2011-12-13 NOTE — Telephone Encounter (Signed)
S/w pt today re new d/t for 4/12 @ 2:30 pm. Pt was not able to come 4/10 due to other appts.

## 2011-12-20 ENCOUNTER — Other Ambulatory Visit: Payer: Self-pay | Admitting: Hematology and Oncology

## 2011-12-20 ENCOUNTER — Ambulatory Visit (HOSPITAL_BASED_OUTPATIENT_CLINIC_OR_DEPARTMENT_OTHER): Payer: Medicare Other | Admitting: Hematology and Oncology

## 2011-12-20 ENCOUNTER — Encounter: Payer: Self-pay | Admitting: Hematology and Oncology

## 2011-12-20 VITALS — BP 142/86 | HR 100 | Temp 98.6°F | Ht 70.0 in | Wt 200.5 lb

## 2011-12-20 DIAGNOSIS — C49A0 Gastrointestinal stromal tumor, unspecified site: Secondary | ICD-10-CM | POA: Insufficient documentation

## 2011-12-20 DIAGNOSIS — C494 Malignant neoplasm of connective and soft tissue of abdomen: Secondary | ICD-10-CM

## 2011-12-20 NOTE — Progress Notes (Signed)
CC:   Henrine Screws, M.D. Cletis Athens, M.D. Sammuel Hines. Daiva Nakayama, M.D.  IDENTIFYING STATEMENT:  Patient is a 71 year old man with the GIST tumor who presents for followup.  INTERVAL HISTORY:  Mr. Luu returns for his annual followup.  He notes stable weight.  He has no current issues or concerns.  He has not noted any rectal bleeding.  We reviewed recent CT scan of the abdomen and pelvis on 12/2011.  There was no evidence of recurrent GI stromal tumor in the upper abdomen.  The liver did show diffuse hepatic steatosis and  Emphysema in both bases.  There was a benign-appearing right kidney lower pole cyst.  There was also a small paraumbilical ventral hernia containing adipose tissue.  MEDICATIONS:  Reviewed and updated.  ALLERGIES:  Codeine and tetanus toxoid.  REVIEW OF SYSTEMS:  A 10-point review of systems negative.  PHYSICAL EXAM:  General:  Patient is alert and oriented x3.  Vitals: Pulse 100, blood pressure 142/86, temperature 98.6, respirations 20, weight 200 pounds.  HEENT:  head is atraumatic, normocephalic.  Sclerae anicteric.  Mouth moist.  Neck:  Supple.  Chest:  Clear.  CVS: Unremarkable.  Abdomen:  Soft, nontender.  Bowel sounds present. Extremities:  No calf tenderness.  Pulses present and symmetrical.  LAB DATA:  12/10/2011, sodium 141, potassium 4.6, chloride 97, CO2 30, BUN 14, creatinine 1.5, glucose 100, total bilirubin 0.5.  IMPRESSION AND PLAN:  Mr. 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  He underwent surveillance.  His exam and scans show no evidence of recurrence.  At this time, he has not had an up-to-date upper and lower endoscopy and I would recommend that he receives one soon. He follows up  in one years time or sooner if needed.     ______________________________ Nira Retort, M.D. LIO/MEDQ  D:   12/20/2011  T:  12/20/2011  Job:  [id number]

## 2011-12-20 NOTE — Patient Instructions (Signed)
Patient to follow up as instructed.   Current Outpatient Prescriptions  Medication Sig Dispense Refill  . clonazePAM (KLONOPIN) 1 MG tablet Take 1 mg by mouth daily.      . clopidogrel (PLAVIX) 75 MG tablet Take 75 mg by mouth daily.      . diazepam (VALIUM) 5 MG tablet Take 5 mg by mouth 2 (two) times daily.      . diclofenac (VOLTAREN) 75 MG EC tablet Take 75 mg by mouth daily.      . folic acid (FOLVITE) 1 MG tablet Take 1 mg by mouth daily.      . Hydrocodone-Acetaminophen (VICODIN HP) 10-660 MG TABS Take 5 tablets by mouth daily.      . misoprostol (CYTOTEC) 200 MCG tablet Take 200 mcg by mouth daily.      . pantoprazole (PROTONIX) 40 MG tablet Take 40 mg by mouth 2 (two) times daily.      Marland Kitchen terazosin (HYTRIN) 2 MG capsule Take 2 mg by mouth at bedtime.            April 2013  _0      1   2   LAB MO     9:30 AM  (15 min.)  Marni Griffon  [facility] MEDICAL ONCOLOG[facility]   CT BODY W/  10:30 AM  (30 min.)  [facility]-Ct 1   [facility] _1 Happy [address]!   11   12   EST PT 30   2:30 PM  (30 min.)  Nira Retort, MD  [place] _2 30

## 2011-12-20 NOTE — Progress Notes (Signed)
This office note has been dictated.

## 2012-08-26 ENCOUNTER — Encounter (INDEPENDENT_AMBULATORY_CARE_PROVIDER_SITE_OTHER): Payer: Self-pay | Admitting: General Surgery

## 2012-08-26 ENCOUNTER — Telehealth (INDEPENDENT_AMBULATORY_CARE_PROVIDER_SITE_OTHER): Payer: Self-pay | Admitting: General Surgery

## 2012-08-26 NOTE — Telephone Encounter (Signed)
LMOM letting pt know he has an appt w/ Dr. Marlou Starks on 12/31 at 9:10.

## 2012-09-08 ENCOUNTER — Encounter (INDEPENDENT_AMBULATORY_CARE_PROVIDER_SITE_OTHER): Payer: Self-pay | Admitting: General Surgery

## 2012-09-08 ENCOUNTER — Ambulatory Visit (INDEPENDENT_AMBULATORY_CARE_PROVIDER_SITE_OTHER): Payer: Medicare Other | Admitting: General Surgery

## 2012-09-08 VITALS — BP 162/84 | HR 80 | Temp 97.6°F | Resp 22 | Ht 70.0 in | Wt 201.1 lb

## 2012-09-08 DIAGNOSIS — K439 Ventral hernia without obstruction or gangrene: Secondary | ICD-10-CM | POA: Insufficient documentation

## 2012-09-08 NOTE — Progress Notes (Signed)
Subjective:     Patient ID: Brett Scott, male   DOB: 06-14-1941, 71 y.o.   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 Over the last 6 months he has noticed a bulge to the left of his bellybutton. He has been experiencing some nausea but no vomiting. He states that he mostly sits around in a chair all day and never really doesn't physical activity. He does get significantly short of breath when he lies flat.  Review of Systems  Constitutional: Negative.   HENT: Negative.   Eyes: Negative.   Respiratory: Positive for shortness of breath.   Cardiovascular: Negative.   Gastrointestinal: Positive for nausea and abdominal pain.  Genitourinary: Negative.   Musculoskeletal: Negative.   Skin: Negative.   Neurological: Negative.   Hematological: Negative.   Psychiatric/Behavioral: Negative.        Objective:   Physical Exam  Constitutional: He is oriented to person, place, and time. He appears well-developed and well-nourished.  HENT:  Head: Normocephalic and atraumatic.  Eyes: Conjunctivae normal and EOM are normal. Pupils are equal, round, and reactive to light.  Neck: Normal range of motion. Neck supple.  Cardiovascular: Normal rate, regular rhythm and normal heart sounds.   Pulmonary/Chest: Effort normal and breath sounds normal.  Abdominal: Soft. Bowel sounds are normal.       There is a palpable fascial defect just to the left of the umbilicus. This appears to be inferior to the area of his old hernia repair. The hernia reduces easily. There is tenderness to palpation over the hernia. There is no cellulitis to the skin.  Musculoskeletal: Normal range of motion.  Neurological: He is alert and oriented to person, place, and time.  Skin: Skin is warm and dry.  Psychiatric: He has a normal mood and affect. His  behavior is normal.       Assessment:     The patient appears to have a ventral hernia that is reducible located below his previous hernia repair. Because of the risk of incarceration strangulate nothing could benefit from having this fixed. I think he is still a good laparoscopic candidate. I have discussed the details of the surgery including the risks and benefits as well as some of the technical aspects with him and he understands and wishes to proceed. For surgery planning a figure of the reasonable to repeat his CT scan of his abdomen and pelvis. This will also help rule out any recurrence of his GI stromal tumor. Prior to scheduling surgery I think he needs to be evaluated by a cardiologist for his shortness of breath.    Plan:     Plan for cardiac clearance and then laparoscopic ventral hernia repair with mesh

## 2012-09-08 NOTE — Patient Instructions (Signed)
Will get cardiac clearance in preparation for ventral hernia repair with mesh

## 2012-09-09 ENCOUNTER — Emergency Department (HOSPITAL_COMMUNITY): Payer: Medicare Other

## 2012-09-09 ENCOUNTER — Encounter (HOSPITAL_COMMUNITY): Payer: Self-pay | Admitting: *Deleted

## 2012-09-09 ENCOUNTER — Emergency Department (HOSPITAL_COMMUNITY)
Admission: EM | Admit: 2012-09-09 | Discharge: 2012-09-09 | Disposition: A | Discharge status: Home / Self Care | Payer: Medicare Other | Attending: Emergency Medicine | Admitting: Emergency Medicine

## 2012-09-09 DIAGNOSIS — Z8719 Personal history of other diseases of the digestive system: Secondary | ICD-10-CM | POA: Insufficient documentation

## 2012-09-09 DIAGNOSIS — R0602 Shortness of breath: Secondary | ICD-10-CM | POA: Insufficient documentation

## 2012-09-09 DIAGNOSIS — Z859 Personal history of malignant neoplasm, unspecified: Secondary | ICD-10-CM | POA: Insufficient documentation

## 2012-09-09 DIAGNOSIS — R079 Chest pain, unspecified: Secondary | ICD-10-CM | POA: Insufficient documentation

## 2012-09-09 DIAGNOSIS — I10 Essential (primary) hypertension: Secondary | ICD-10-CM | POA: Insufficient documentation

## 2012-09-09 DIAGNOSIS — Z87448 Personal history of other diseases of urinary system: Secondary | ICD-10-CM | POA: Insufficient documentation

## 2012-09-09 DIAGNOSIS — Z87891 Personal history of nicotine dependence: Secondary | ICD-10-CM | POA: Insufficient documentation

## 2012-09-09 DIAGNOSIS — Z8679 Personal history of other diseases of the circulatory system: Secondary | ICD-10-CM | POA: Insufficient documentation

## 2012-09-09 DIAGNOSIS — Z8673 Personal history of transient ischemic attack (TIA), and cerebral infarction without residual deficits: Secondary | ICD-10-CM | POA: Insufficient documentation

## 2012-09-09 DIAGNOSIS — J449 Chronic obstructive pulmonary disease, unspecified: Secondary | ICD-10-CM | POA: Insufficient documentation

## 2012-09-09 DIAGNOSIS — K219 Gastro-esophageal reflux disease without esophagitis: Secondary | ICD-10-CM | POA: Insufficient documentation

## 2012-09-09 DIAGNOSIS — G8929 Other chronic pain: Secondary | ICD-10-CM | POA: Insufficient documentation

## 2012-09-09 DIAGNOSIS — M549 Dorsalgia, unspecified: Secondary | ICD-10-CM | POA: Insufficient documentation

## 2012-09-09 DIAGNOSIS — Z8739 Personal history of other diseases of the musculoskeletal system and connective tissue: Secondary | ICD-10-CM | POA: Insufficient documentation

## 2012-09-09 DIAGNOSIS — J4489 Other specified chronic obstructive pulmonary disease: Secondary | ICD-10-CM | POA: Insufficient documentation

## 2012-09-09 DIAGNOSIS — Z79899 Other long term (current) drug therapy: Secondary | ICD-10-CM | POA: Insufficient documentation

## 2012-09-09 DIAGNOSIS — E785 Hyperlipidemia, unspecified: Secondary | ICD-10-CM | POA: Insufficient documentation

## 2012-09-09 LAB — CBC
HCT: 37 % — ABNORMAL LOW (ref 39.0–52.0)
Hemoglobin: 12.1 g/dL — ABNORMAL LOW (ref 13.0–17.0)
WBC: 7 10*3/uL (ref 4.0–10.5)

## 2012-09-09 LAB — BASIC METABOLIC PANEL
BUN: 11 mg/dL (ref 6–23)
Chloride: 101 mEq/L (ref 96–112)
Glucose, Bld: 90 mg/dL (ref 70–99)
Potassium: 3.9 mEq/L (ref 3.5–5.1)

## 2012-09-09 NOTE — ED Notes (Signed)
Micheal son will be back in 71 minutes [phone]

## 2012-09-09 NOTE — ED Notes (Signed)
To ED for eval of cp. States he feels like he's having a heart attack. Pt states he took 2 nitro with some relief. Pt appears grey. Talking in full complete sentences. Also c/o sob.

## 2012-09-09 NOTE — ED Provider Notes (Signed)
History     CSN: [id number]  Arrival date & time 09/09/12  1357   First MD Initiated Contact with Patient 09/09/12 1457      Chief Complaint  Patient presents with  . Chest Pain    (Consider location/radiation/quality/duration/timing/severity/associated sxs/prior treatment) HPI Comments: Patient presents with jaw pain. He states he has a history of esophageal spasms and presents with discomfort to his jaw going down into his epigastric area. He states he's had the same type of pain for years and was diagnosed with esophageal spasm as etiology for the pain. Due to the same type of pain he had a stress test about 2 years ago which she states was normal. He states he was told by the cardiologist that this jaw pain is not coming from his heart. He states that about 2 hours ago he started having this jaw pain radiating to his epigastric area. It lasted about 2 hours. He's had a similar type symptoms in the past and and says that there is nothing different about this episode. He initially took some Valium without relief and then he took some nitroglycerin which also did not really help his pain however it went away on its own after arrival to the emergency department. He denies any associated symptoms of shortness of breath, diaphoresis or nausea. He states he has a baseline shortness of breath but that is unchanged from his baseline. I asked him if there is anything different today as compared to his past episodes of esophageal spasms and he says it feels exactly the same. The triage note states that he felt like he was having a heart attack but he tells me that this feels the same as his past esophageal spasms. He states that he is currently scheduled to have a ventral hernia repair and is supposed to see a cardiologist next week to be cleared for the surgery. He denies any past history of heart problems. He does have a history of a AAA repair but currently denies any abdominal pain. He denies any  dizziness. He denies any shoulder or back pain.  Patient is a 72 y.o. male presenting with chest pain.  Chest Pain Primary symptoms include shortness of breath (unchanged from baseline). Pertinent negatives for primary symptoms include no fever, no fatigue, no cough, no abdominal pain, no nausea, no vomiting and no dizziness.  Pertinent negatives for associated symptoms include no diaphoresis, no numbness and no weakness.     Past Medical History  Diagnosis Date  . Hypertension   . Chronic back pain     secondary to lumbar DJD  . Peripheral vascular disease   . Hyperlipidemia   . GERD (gastroesophageal reflux disease)   . History of multiple pulmonary nodules 2009    non-neoplastic  . Ventral hernia   . COPD (chronic obstructive pulmonary disease)   . Cerebral vascular disease 200    w/ ulcerated left carotid artery   . Iliac artery aneurysm, bilateral   . Sigmoid diverticulosis   . BPH (benign prostatic hyperplasia)   . Renal calculi   . Abdominal pain   . Cancer   . Arthritis   . Chills   . Fever   . Difficulty urinating     Past Surgical History  Procedure Date  . Cholecystectomy   . Hernia repair   . Aorta surgery     History reviewed. No pertinent family history.  History  Substance Use Topics  . Smoking status: Former Smoker -- 1.0 packs/day for  50 years    Types: Cigarettes    Quit date: 09/08/1997  . Smokeless tobacco: Never Used  . Alcohol Use: No      Review of Systems  Constitutional: Negative for fever, chills, diaphoresis and fatigue.  HENT: Negative for congestion, rhinorrhea and sneezing.   Eyes: Negative.   Respiratory: Positive for shortness of breath (unchanged from baseline). Negative for cough and chest tightness.   Cardiovascular: Positive for chest pain. Negative for leg swelling.  Gastrointestinal: Negative for nausea, vomiting, abdominal pain, diarrhea and blood in stool.  Genitourinary: Negative for frequency, hematuria, flank  pain and difficulty urinating.  Musculoskeletal: Negative for back pain and arthralgias.  Skin: Negative for rash.  Neurological: Negative for dizziness, speech difficulty, weakness, numbness and headaches.    Allergies  Codeine; Quinine derivatives; Sulfa antibiotics; and Tetanus toxoid  Home Medications   Current Outpatient Rx  Name  Route  Sig  Dispense  Refill  . CETIRIZINE HCL 10 MG PO TABS   Oral   Take 10 mg by mouth daily.         Marland Kitchen CLONAZEPAM 1 MG PO TABS   Oral   Take 1 mg by mouth daily.         Marland Kitchen CLOPIDOGREL BISULFATE 75 MG PO TABS   Oral   Take 75 mg by mouth daily.         Marland Kitchen DIAZEPAM 5 MG PO TABS   Oral   Take 5 mg by mouth 2 (two) times daily as needed. For anxiety         . DICLOFENAC SODIUM 75 MG PO TBEC   Oral   Take 75 mg by mouth every other day.          Marland Kitchen VITAMIN D2 PO   Oral   Take 1 tablet by mouth every evening.          Marland Kitchen FOLIC ACID 1 MG PO TABS   Oral   Take 1 mg by mouth daily.         Marland Kitchen HYDROCODONE-ACETAMINOPHEN 10-660 MG PO TABS   Oral   Take 1-2 tablets by mouth every 4 (four) hours as needed. For pain         . MISOPROSTOL 200 MCG PO TABS   Oral   Take 200 mcg by mouth daily.         Marland Kitchen NITROGLYCERIN 0.4 MG SL SUBL   Sublingual   Place 0.4 mg under the tongue every 5 (five) minutes as needed. For chest pain         . PANTOPRAZOLE SODIUM 40 MG PO TBEC   Oral   Take 40 mg by mouth 2 (two) times daily.         . SENNA-DOCUSATE SODIUM 8.6-50 MG PO TABS   Oral   Take 1 tablet by mouth daily as needed. For constipation         . TERAZOSIN HCL 2 MG PO CAPS   Oral   Take 2 mg by mouth at bedtime.           BP 135/76  Pulse 79  Temp 98 F (36.7 C) (Oral)  Resp 16  SpO2 99%  Physical Exam  Constitutional: He is oriented to person, place, and time. He appears well-developed and well-nourished.  HENT:  Head: Normocephalic and atraumatic.  Eyes: Pupils are equal, round, and reactive to light.    Neck: Normal range of motion. Neck supple.  Cardiovascular: Normal rate, regular rhythm and  normal heart sounds.   Pulmonary/Chest: Effort normal and breath sounds normal. No respiratory distress. He has no wheezes. He has no rales. He exhibits no tenderness.  Abdominal: Soft. Bowel sounds are normal. There is no tenderness. There is no rebound and no guarding.  Musculoskeletal: Normal range of motion. He exhibits no edema and no tenderness.  Lymphadenopathy:    He has no cervical adenopathy.  Neurological: He is alert and oriented to person, place, and time.  Skin: Skin is warm and dry. No rash noted.  Psychiatric: He has a normal mood and affect.    ED Course  Procedures (including critical care time)  Results for orders placed during the hospital encounter of 09/09/12  CBC      Component Value Range   WBC 7.0  4.0 - 10.5 K/uL   RBC 3.97 (*) 4.22 - 5.81 MIL/uL   Hemoglobin 12.1 (*) 13.0 - 17.0 g/dL   HCT 37.0 (*) 39.0 - 52.0 %   MCV 93.2  78.0 - 100.0 fL   MCH 30.5  26.0 - 34.0 pg   MCHC 32.7  30.0 - 36.0 g/dL   RDW 12.9  11.5 - 15.5 %   Platelets 226  150 - 400 K/uL  BASIC METABOLIC PANEL      Component Value Range   Sodium 137  135 - 145 mEq/L   Potassium 3.9  3.5 - 5.1 mEq/L   Chloride 101  96 - 112 mEq/L   CO2 26  19 - 32 mEq/L   Glucose, Bld 90  70 - 99 mg/dL   BUN 11  6 - 23 mg/dL   Creatinine, Ser 1.50 (*) 0.50 - 1.35 mg/dL   Calcium 9.0  8.4 - 10.5 mg/dL   GFR calc non Af Amer 45 (*) >90 mL/min   GFR calc Af Amer 52 (*) >90 mL/min  POCT I-STAT TROPONIN I      Component Value Range   Troponin i, poc 0.00  0.00 - 0.08 ng/mL   Comment 3            No results found.   Date: 09/09/2012  Rate: 81  Rhythm: normal sinus rhythm  QRS Axis: normal  Intervals: normal  ST/T Wave abnormalities: nonspecific ST/T changes  Conduction Disutrbances:none  Narrative Interpretation:   Old EKG Reviewed: changes notedslight change in T wave flattening/inversion  inferiorly    1. Chest pain       MDM  Patient presents with jaw pain which he says is consistent with his past esophageal spasms. He is pain-free now He had some minor changes on his EKG. His troponin is negative. I discussed options with patient including stain for his second troponin versus keeping him overnight for observation. I did discuss he had some minor changes on his EKG. At this point he is repeating this today. He states that he's going to accept the consequences of going home even if that includes dying from a heart attack. He does have an appointment next week to followup with a cardiologist. I advised to return here if his symptoms worsen.     Malvin Johns, MD 09/09/12 [phone]

## 2012-09-09 NOTE — ED Notes (Signed)
Has hx of GI issues- felt like esophageal spasm- per patient, has had spasms in past.

## 2012-09-11 ENCOUNTER — Telehealth (INDEPENDENT_AMBULATORY_CARE_PROVIDER_SITE_OTHER): Payer: Self-pay | Admitting: General Surgery

## 2012-09-11 ENCOUNTER — Inpatient Hospital Stay: Admission: RE | Admit: 2012-09-11 | Payer: Medicare Other | Source: Ambulatory Visit

## 2012-09-11 NOTE — Telephone Encounter (Signed)
Pt of Dr. Marlou Starks was made an appt with Dr. Johnsie Cancel for cardiac clearance.  Pt went to ED with chest pain and has appt next week (per his PCP) to see Dr. Irish Lack.  Reminded pt that Dr. Marlou Starks needed to be included in getting records from the cardiologist for his clearance for surgery.  Pt will sign necessary paperwork at that appt.  He doesn't know if anyone has cancelled the appt with Dr. Johnsie Cancel for 09/29/12.

## 2012-09-11 NOTE — Telephone Encounter (Signed)
Called Dr Johnsie Cancel office and cxd appt.

## 2012-09-16 ENCOUNTER — Inpatient Hospital Stay: Admission: RE | Admit: 2012-09-16 | Payer: Medicare Other | Source: Ambulatory Visit

## 2012-09-29 ENCOUNTER — Institutional Professional Consult (permissible substitution): Payer: Medicare Other | Admitting: Cardiovascular Disease

## 2012-10-01 ENCOUNTER — Other Ambulatory Visit (INDEPENDENT_AMBULATORY_CARE_PROVIDER_SITE_OTHER): Payer: Self-pay | Admitting: General Surgery

## 2012-10-10 ENCOUNTER — Telehealth: Payer: Self-pay | Admitting: *Deleted

## 2012-10-10 NOTE — Telephone Encounter (Addendum)
Per patient reassignment I have contacted the patient reagarding his appts.  Patient had several questions reagarding these appts. First the patient stated that " Dr. Pearletha Furl told me that if I would have had a upper endoscopy doen she would have released me." Patient has had endoscopy done at Dr. Inda Merlin office at [place]. Patient also questioned " I have a CT scan for Dr. Marlou Starks for some surgery I'm having, can the scan for Dr. Jamse Arn also be done at the same time." Patient having the scan done at [place]. I have told the patient that I have taken all this information and will get back to him on Monday with answers. I have canceled appt with Dr. Jamse Arn. Patient to see Dr. Benay Spice.   JMW

## 2012-10-12 ENCOUNTER — Telehealth (INDEPENDENT_AMBULATORY_CARE_PROVIDER_SITE_OTHER): Payer: Self-pay | Admitting: General Surgery

## 2012-10-12 NOTE — Telephone Encounter (Signed)
Patient calling because he was told he needed a CT abdomen and pelvis by Dr Marlou Starks at the end of December - he was then told he needed a follow up monitoring CT from the cancer center after this and was told by the nurse at [facility] that these could be combined appts. They then scheduled his monitoring appt for the end of March 2014 and he wants his CT abdomen/pelvis sooner that this and then he was told the order was expired. Please set him up for CT abd/pelvis as soon as possible. Call him at [phone].

## 2012-10-13 ENCOUNTER — Telehealth (INDEPENDENT_AMBULATORY_CARE_PROVIDER_SITE_OTHER): Payer: Self-pay | Admitting: General Surgery

## 2012-10-13 ENCOUNTER — Other Ambulatory Visit (INDEPENDENT_AMBULATORY_CARE_PROVIDER_SITE_OTHER): Payer: Self-pay

## 2012-10-13 DIAGNOSIS — K439 Ventral hernia without obstruction or gangrene: Secondary | ICD-10-CM

## 2012-10-13 NOTE — Telephone Encounter (Signed)
Order put in and given to susan for scheduling.

## 2012-10-13 NOTE — Telephone Encounter (Signed)
Spoke with patient he has appt 10/16/12 _0 :45  For ct abd pelvis with contrast .Patient has contrast already . Instructed no solids 4 hours prior  But can take medications and have liquids and normal. Drink 1 bottle at 7am and 1bottle  at 8am per Tonya  At site .

## 2012-10-16 ENCOUNTER — Ambulatory Visit
Admission: RE | Admit: 2012-10-16 | Discharge: 2012-10-16 | Disposition: A | Discharge status: Home / Self Care | Payer: Medicare Other | Source: Ambulatory Visit | Attending: General Surgery | Admitting: General Surgery

## 2012-10-16 ENCOUNTER — Inpatient Hospital Stay: Admission: RE | Admit: 2012-10-16 | Payer: Medicare Other | Source: Ambulatory Visit

## 2012-10-16 DIAGNOSIS — K439 Ventral hernia without obstruction or gangrene: Secondary | ICD-10-CM

## 2012-10-16 MED ORDER — IOHEXOL 300 MG/ML  SOLN
80.0000 mL | Freq: Once | INTRAMUSCULAR | Status: AC | PRN
  Administered 2012-10-16: 80 mL via INTRAVENOUS

## 2012-11-24 ENCOUNTER — Telehealth (INDEPENDENT_AMBULATORY_CARE_PROVIDER_SITE_OTHER): Payer: Self-pay | Admitting: General Surgery

## 2012-11-24 NOTE — Telephone Encounter (Signed)
Patient made aware CT did show a hernia and we will proceed with surgery once we get cardiac clearance. He states he is in no rush because his wife had to have fibroids removed emergently while she was in [place] and she has not come home yet. Aware we will contact him when we get clearance and if he is not ready at that point, he can let us know when he is. He is in agreement with this plan.

## 2012-11-24 NOTE — Telephone Encounter (Signed)
Message copied by Margarette Asal on Tue Nov 24, 2012  3:44 PM ------      Message from: Luella Cook III      Created: Fri Oct 16, 2012  2:03 PM       Waiting on cardiac clearance to schedule ------

## 2012-12-02 ENCOUNTER — Telehealth: Payer: Self-pay | Admitting: Oncology

## 2012-12-02 NOTE — Telephone Encounter (Signed)
S/w pt today re holding off on his appts here for lb/ct 3/28 and BS 4/29. Made follow up call to 2/1 all made to pt (see notes). Pt states he wants to cx ct completely due to he had one February 2014 and doesn't want to do another one. Per pt he is currently awaiting surgery with Dr. Marlou Starks. Per pt he has seen his primary, cardiologist and surgeon this year and is dealing with his wife being sick oversees and trying to get her back home. Pt wants to hold off on appts here until after surgery and he will call us back to r/s but doesn't want to do the ct. appts for lb/fu cx'd and I s/w Cheryl in [place] to cx ct. Message sent to [facility] and Dr. Benay Spice re this patient.

## 2012-12-04 ENCOUNTER — Other Ambulatory Visit: Payer: Medicare Other | Admitting: Lab

## 2012-12-04 ENCOUNTER — Ambulatory Visit (HOSPITAL_COMMUNITY): Payer: Medicare Other

## 2012-12-08 ENCOUNTER — Ambulatory Visit: Payer: Medicare Other | Admitting: Hematology and Oncology

## 2013-01-05 ENCOUNTER — Ambulatory Visit: Payer: Medicare Other | Admitting: Oncology

## 2013-07-14 ENCOUNTER — Encounter: Payer: Self-pay | Admitting: Interventional Cardiology

## 2013-10-01 ENCOUNTER — Ambulatory Visit: Payer: Medicare Other | Admitting: Interventional Cardiology

## 2013-10-15 ENCOUNTER — Encounter: Payer: Self-pay | Admitting: Cardiology

## 2013-10-15 DIAGNOSIS — E782 Mixed hyperlipidemia: Secondary | ICD-10-CM

## 2013-10-15 DIAGNOSIS — Z9889 Other specified postprocedural states: Principal | ICD-10-CM

## 2013-10-15 DIAGNOSIS — Z8679 Personal history of other diseases of the circulatory system: Secondary | ICD-10-CM

## 2013-10-19 ENCOUNTER — Ambulatory Visit: Payer: Self-pay | Admitting: Interventional Cardiology

## 2015-02-24 ENCOUNTER — Other Ambulatory Visit: Payer: Self-pay | Admitting: Otolaryngology

## 2015-02-24 DIAGNOSIS — H9122 Sudden idiopathic hearing loss, left ear: Secondary | ICD-10-CM

## 2015-02-24 DIAGNOSIS — H903 Sensorineural hearing loss, bilateral: Secondary | ICD-10-CM

## 2015-04-04 ENCOUNTER — Ambulatory Visit
Admission: RE | Admit: 2015-04-04 | Discharge: 2015-04-04 | Disposition: A | Discharge status: Home / Self Care | Payer: PPO | Source: Ambulatory Visit | Attending: Internal Medicine | Admitting: Internal Medicine

## 2015-04-04 ENCOUNTER — Other Ambulatory Visit: Payer: Self-pay | Admitting: Internal Medicine

## 2015-04-04 DIAGNOSIS — R634 Abnormal weight loss: Secondary | ICD-10-CM

## 2015-04-13 ENCOUNTER — Other Ambulatory Visit: Payer: Self-pay | Admitting: Internal Medicine

## 2015-04-13 DIAGNOSIS — R634 Abnormal weight loss: Secondary | ICD-10-CM

## 2015-04-14 ENCOUNTER — Other Ambulatory Visit: Payer: PPO

## 2015-04-20 ENCOUNTER — Ambulatory Visit
Admission: RE | Admit: 2015-04-20 | Discharge: 2015-04-20 | Disposition: A | Discharge status: Home / Self Care | Payer: PPO | Source: Ambulatory Visit | Attending: Internal Medicine | Admitting: Internal Medicine

## 2015-04-20 DIAGNOSIS — R634 Abnormal weight loss: Secondary | ICD-10-CM

## 2015-09-20 DIAGNOSIS — R21 Rash and other nonspecific skin eruption: Secondary | ICD-10-CM | POA: Diagnosis not present

## 2015-09-20 DIAGNOSIS — M2550 Pain in unspecified joint: Secondary | ICD-10-CM | POA: Diagnosis not present

## 2015-09-20 DIAGNOSIS — M5116 Intervertebral disc disorders with radiculopathy, lumbar region: Secondary | ICD-10-CM | POA: Diagnosis not present

## 2015-09-20 DIAGNOSIS — M0609 Rheumatoid arthritis without rheumatoid factor, multiple sites: Secondary | ICD-10-CM | POA: Diagnosis not present

## 2015-09-26 DIAGNOSIS — L309 Dermatitis, unspecified: Secondary | ICD-10-CM | POA: Diagnosis not present

## 2015-09-26 DIAGNOSIS — L853 Xerosis cutis: Secondary | ICD-10-CM | POA: Diagnosis not present

## 2015-09-26 DIAGNOSIS — I672 Cerebral atherosclerosis: Secondary | ICD-10-CM | POA: Diagnosis not present

## 2015-09-26 DIAGNOSIS — C169 Malignant neoplasm of stomach, unspecified: Secondary | ICD-10-CM | POA: Diagnosis not present

## 2015-09-26 DIAGNOSIS — R634 Abnormal weight loss: Secondary | ICD-10-CM | POA: Diagnosis not present

## 2015-09-26 DIAGNOSIS — G4701 Insomnia due to medical condition: Secondary | ICD-10-CM | POA: Diagnosis not present

## 2015-09-26 DIAGNOSIS — M5489 Other dorsalgia: Secondary | ICD-10-CM | POA: Diagnosis not present

## 2015-09-26 DIAGNOSIS — M79643 Pain in unspecified hand: Secondary | ICD-10-CM | POA: Diagnosis not present

## 2015-10-24 DIAGNOSIS — Z0000 Encounter for general adult medical examination without abnormal findings: Secondary | ICD-10-CM | POA: Diagnosis not present

## 2015-10-24 DIAGNOSIS — Z1389 Encounter for screening for other disorder: Secondary | ICD-10-CM | POA: Diagnosis not present

## 2015-11-01 DIAGNOSIS — M5116 Intervertebral disc disorders with radiculopathy, lumbar region: Secondary | ICD-10-CM | POA: Diagnosis not present

## 2015-11-01 DIAGNOSIS — M2550 Pain in unspecified joint: Secondary | ICD-10-CM | POA: Diagnosis not present

## 2015-11-01 DIAGNOSIS — M0609 Rheumatoid arthritis without rheumatoid factor, multiple sites: Secondary | ICD-10-CM | POA: Diagnosis not present

## 2015-11-01 DIAGNOSIS — N183 Chronic kidney disease, stage 3 (moderate): Secondary | ICD-10-CM | POA: Diagnosis not present

## 2015-12-04 ENCOUNTER — Other Ambulatory Visit (HOSPITAL_COMMUNITY): Payer: Self-pay | Admitting: Internal Medicine

## 2015-12-04 DIAGNOSIS — R079 Chest pain, unspecified: Secondary | ICD-10-CM | POA: Diagnosis not present

## 2015-12-04 DIAGNOSIS — I672 Cerebral atherosclerosis: Secondary | ICD-10-CM | POA: Diagnosis not present

## 2015-12-04 DIAGNOSIS — R109 Unspecified abdominal pain: Secondary | ICD-10-CM | POA: Diagnosis not present

## 2015-12-04 DIAGNOSIS — G4701 Insomnia due to medical condition: Secondary | ICD-10-CM | POA: Diagnosis not present

## 2015-12-04 DIAGNOSIS — M5489 Other dorsalgia: Secondary | ICD-10-CM | POA: Diagnosis not present

## 2015-12-04 DIAGNOSIS — F329 Major depressive disorder, single episode, unspecified: Secondary | ICD-10-CM | POA: Diagnosis not present

## 2015-12-04 DIAGNOSIS — R634 Abnormal weight loss: Secondary | ICD-10-CM | POA: Diagnosis not present

## 2015-12-04 DIAGNOSIS — G8929 Other chronic pain: Secondary | ICD-10-CM

## 2015-12-04 DIAGNOSIS — C169 Malignant neoplasm of stomach, unspecified: Secondary | ICD-10-CM | POA: Diagnosis not present

## 2015-12-06 ENCOUNTER — Ambulatory Visit (HOSPITAL_COMMUNITY): Payer: PPO

## 2015-12-18 ENCOUNTER — Ambulatory Visit (HOSPITAL_COMMUNITY): Payer: PPO

## 2015-12-20 ENCOUNTER — Ambulatory Visit (HOSPITAL_COMMUNITY)
Admission: RE | Admit: 2015-12-20 | Discharge: 2015-12-20 | Disposition: A | Discharge status: Home / Self Care | Payer: PPO | Source: Ambulatory Visit | Attending: Internal Medicine | Admitting: Internal Medicine

## 2015-12-20 ENCOUNTER — Encounter (HOSPITAL_COMMUNITY): Payer: Self-pay

## 2015-12-20 DIAGNOSIS — K562 Volvulus: Secondary | ICD-10-CM | POA: Diagnosis not present

## 2015-12-20 DIAGNOSIS — K439 Ventral hernia without obstruction or gangrene: Secondary | ICD-10-CM | POA: Insufficient documentation

## 2015-12-20 DIAGNOSIS — Z9049 Acquired absence of other specified parts of digestive tract: Secondary | ICD-10-CM | POA: Insufficient documentation

## 2015-12-20 DIAGNOSIS — N281 Cyst of kidney, acquired: Secondary | ICD-10-CM | POA: Diagnosis not present

## 2015-12-20 DIAGNOSIS — I714 Abdominal aortic aneurysm, without rupture: Secondary | ICD-10-CM | POA: Insufficient documentation

## 2015-12-20 DIAGNOSIS — K5730 Diverticulosis of large intestine without perforation or abscess without bleeding: Secondary | ICD-10-CM | POA: Diagnosis not present

## 2015-12-20 DIAGNOSIS — G8929 Other chronic pain: Secondary | ICD-10-CM | POA: Insufficient documentation

## 2015-12-20 DIAGNOSIS — R109 Unspecified abdominal pain: Secondary | ICD-10-CM | POA: Insufficient documentation

## 2015-12-20 DIAGNOSIS — J982 Interstitial emphysema: Secondary | ICD-10-CM | POA: Insufficient documentation

## 2015-12-20 MED ORDER — IOPAMIDOL (ISOVUE-300) INJECTION 61%
80.0000 mL | Freq: Once | INTRAVENOUS | Status: AC | PRN
  Administered 2015-12-20: 80 mL via INTRAVENOUS

## 2015-12-25 DIAGNOSIS — N183 Chronic kidney disease, stage 3 (moderate): Secondary | ICD-10-CM | POA: Diagnosis not present

## 2015-12-25 DIAGNOSIS — M0609 Rheumatoid arthritis without rheumatoid factor, multiple sites: Secondary | ICD-10-CM | POA: Diagnosis not present

## 2015-12-25 DIAGNOSIS — M2550 Pain in unspecified joint: Secondary | ICD-10-CM | POA: Diagnosis not present

## 2015-12-25 DIAGNOSIS — M5116 Intervertebral disc disorders with radiculopathy, lumbar region: Secondary | ICD-10-CM | POA: Diagnosis not present

## 2016-02-26 DIAGNOSIS — C169 Malignant neoplasm of stomach, unspecified: Secondary | ICD-10-CM | POA: Diagnosis not present

## 2016-02-26 DIAGNOSIS — R109 Unspecified abdominal pain: Secondary | ICD-10-CM | POA: Diagnosis not present

## 2016-02-26 DIAGNOSIS — M5489 Other dorsalgia: Secondary | ICD-10-CM | POA: Diagnosis not present

## 2016-02-26 DIAGNOSIS — F329 Major depressive disorder, single episode, unspecified: Secondary | ICD-10-CM | POA: Diagnosis not present

## 2016-02-26 DIAGNOSIS — G4701 Insomnia due to medical condition: Secondary | ICD-10-CM | POA: Diagnosis not present

## 2016-02-26 DIAGNOSIS — R634 Abnormal weight loss: Secondary | ICD-10-CM | POA: Diagnosis not present

## 2016-02-26 DIAGNOSIS — I672 Cerebral atherosclerosis: Secondary | ICD-10-CM | POA: Diagnosis not present

## 2016-03-28 DIAGNOSIS — M2550 Pain in unspecified joint: Secondary | ICD-10-CM | POA: Diagnosis not present

## 2016-03-28 DIAGNOSIS — Z79899 Other long term (current) drug therapy: Secondary | ICD-10-CM | POA: Diagnosis not present

## 2016-03-28 DIAGNOSIS — M5116 Intervertebral disc disorders with radiculopathy, lumbar region: Secondary | ICD-10-CM | POA: Diagnosis not present

## 2016-03-28 DIAGNOSIS — M0609 Rheumatoid arthritis without rheumatoid factor, multiple sites: Secondary | ICD-10-CM | POA: Diagnosis not present

## 2016-04-18 DIAGNOSIS — Z961 Presence of intraocular lens: Secondary | ICD-10-CM | POA: Diagnosis not present

## 2016-04-18 DIAGNOSIS — H26492 Other secondary cataract, left eye: Secondary | ICD-10-CM | POA: Diagnosis not present

## 2016-04-18 DIAGNOSIS — H04213 Epiphora due to excess lacrimation, bilateral lacrimal glands: Secondary | ICD-10-CM | POA: Diagnosis not present

## 2016-04-18 DIAGNOSIS — H40013 Open angle with borderline findings, low risk, bilateral: Secondary | ICD-10-CM | POA: Diagnosis not present

## 2016-05-03 DIAGNOSIS — Z79899 Other long term (current) drug therapy: Secondary | ICD-10-CM | POA: Diagnosis not present

## 2016-05-21 DIAGNOSIS — R634 Abnormal weight loss: Secondary | ICD-10-CM | POA: Diagnosis not present

## 2016-05-21 DIAGNOSIS — M5489 Other dorsalgia: Secondary | ICD-10-CM | POA: Diagnosis not present

## 2016-05-21 DIAGNOSIS — I672 Cerebral atherosclerosis: Secondary | ICD-10-CM | POA: Diagnosis not present

## 2016-05-21 DIAGNOSIS — R109 Unspecified abdominal pain: Secondary | ICD-10-CM | POA: Diagnosis not present

## 2016-05-21 DIAGNOSIS — F329 Major depressive disorder, single episode, unspecified: Secondary | ICD-10-CM | POA: Diagnosis not present

## 2016-05-21 DIAGNOSIS — C169 Malignant neoplasm of stomach, unspecified: Secondary | ICD-10-CM | POA: Diagnosis not present

## 2016-05-21 DIAGNOSIS — G4701 Insomnia due to medical condition: Secondary | ICD-10-CM | POA: Diagnosis not present

## 2016-07-01 DIAGNOSIS — M0609 Rheumatoid arthritis without rheumatoid factor, multiple sites: Secondary | ICD-10-CM | POA: Diagnosis not present

## 2016-07-01 DIAGNOSIS — M2550 Pain in unspecified joint: Secondary | ICD-10-CM | POA: Diagnosis not present

## 2016-07-01 DIAGNOSIS — Z79899 Other long term (current) drug therapy: Secondary | ICD-10-CM | POA: Diagnosis not present

## 2016-07-01 DIAGNOSIS — M5116 Intervertebral disc disorders with radiculopathy, lumbar region: Secondary | ICD-10-CM | POA: Diagnosis not present

## 2016-10-01 DIAGNOSIS — M2550 Pain in unspecified joint: Secondary | ICD-10-CM | POA: Diagnosis not present

## 2016-10-01 DIAGNOSIS — E663 Overweight: Secondary | ICD-10-CM | POA: Diagnosis not present

## 2016-10-01 DIAGNOSIS — M0609 Rheumatoid arthritis without rheumatoid factor, multiple sites: Secondary | ICD-10-CM | POA: Diagnosis not present

## 2016-10-01 DIAGNOSIS — Z6825 Body mass index (BMI) 25.0-25.9, adult: Secondary | ICD-10-CM | POA: Diagnosis not present

## 2016-10-01 DIAGNOSIS — Z79899 Other long term (current) drug therapy: Secondary | ICD-10-CM | POA: Diagnosis not present

## 2016-10-01 DIAGNOSIS — M5116 Intervertebral disc disorders with radiculopathy, lumbar region: Secondary | ICD-10-CM | POA: Diagnosis not present

## 2016-10-23 DIAGNOSIS — M546 Pain in thoracic spine: Secondary | ICD-10-CM | POA: Diagnosis not present

## 2016-10-23 DIAGNOSIS — N400 Enlarged prostate without lower urinary tract symptoms: Secondary | ICD-10-CM | POA: Diagnosis not present

## 2016-10-23 DIAGNOSIS — I672 Cerebral atherosclerosis: Secondary | ICD-10-CM | POA: Diagnosis not present

## 2016-10-23 DIAGNOSIS — Z1389 Encounter for screening for other disorder: Secondary | ICD-10-CM | POA: Diagnosis not present

## 2016-10-23 DIAGNOSIS — Z79899 Other long term (current) drug therapy: Secondary | ICD-10-CM | POA: Diagnosis not present

## 2016-10-23 DIAGNOSIS — R0609 Other forms of dyspnea: Secondary | ICD-10-CM | POA: Diagnosis not present

## 2016-10-23 DIAGNOSIS — I714 Abdominal aortic aneurysm, without rupture: Secondary | ICD-10-CM | POA: Diagnosis not present

## 2016-10-23 DIAGNOSIS — K219 Gastro-esophageal reflux disease without esophagitis: Secondary | ICD-10-CM | POA: Diagnosis not present

## 2016-10-23 DIAGNOSIS — F329 Major depressive disorder, single episode, unspecified: Secondary | ICD-10-CM | POA: Diagnosis not present

## 2016-10-23 DIAGNOSIS — R0902 Hypoxemia: Secondary | ICD-10-CM | POA: Diagnosis not present

## 2016-10-23 DIAGNOSIS — Z0000 Encounter for general adult medical examination without abnormal findings: Secondary | ICD-10-CM | POA: Diagnosis not present

## 2016-10-23 DIAGNOSIS — E7800 Pure hypercholesterolemia, unspecified: Secondary | ICD-10-CM | POA: Diagnosis not present

## 2016-10-23 DIAGNOSIS — I10 Essential (primary) hypertension: Secondary | ICD-10-CM | POA: Diagnosis not present

## 2016-11-06 DIAGNOSIS — J8410 Pulmonary fibrosis, unspecified: Secondary | ICD-10-CM | POA: Diagnosis not present

## 2016-11-06 DIAGNOSIS — J449 Chronic obstructive pulmonary disease, unspecified: Secondary | ICD-10-CM | POA: Diagnosis not present

## 2016-11-06 DIAGNOSIS — R0609 Other forms of dyspnea: Secondary | ICD-10-CM | POA: Diagnosis not present

## 2016-11-06 DIAGNOSIS — E782 Mixed hyperlipidemia: Secondary | ICD-10-CM | POA: Diagnosis not present

## 2016-11-06 DIAGNOSIS — R0902 Hypoxemia: Secondary | ICD-10-CM | POA: Diagnosis not present

## 2016-11-26 DIAGNOSIS — J449 Chronic obstructive pulmonary disease, unspecified: Secondary | ICD-10-CM | POA: Diagnosis not present

## 2016-12-02 DIAGNOSIS — M3501 Sicca syndrome with keratoconjunctivitis: Secondary | ICD-10-CM | POA: Diagnosis not present

## 2016-12-02 DIAGNOSIS — H0289 Other specified disorders of eyelid: Secondary | ICD-10-CM | POA: Diagnosis not present

## 2016-12-02 DIAGNOSIS — H40013 Open angle with borderline findings, low risk, bilateral: Secondary | ICD-10-CM | POA: Diagnosis not present

## 2016-12-02 DIAGNOSIS — H04213 Epiphora due to excess lacrimation, bilateral lacrimal glands: Secondary | ICD-10-CM | POA: Diagnosis not present

## 2016-12-16 ENCOUNTER — Other Ambulatory Visit: Payer: Self-pay

## 2016-12-16 NOTE — Patient Outreach (Signed)
[facility]) Care Management  [place]  12/16/2016   KYSHON TOLLIVER [birth year] [id number]  Subjective: "They are working on getting the correct machine that I can carry."  Objective: none-telephonic call  Encounter Medications:  Outpatient Encounter Prescriptions as of 12/16/2016  Medication Sig  . clonazePAM (KLONOPIN) 1 MG tablet Take 1 mg by mouth daily.  . clopidogrel (PLAVIX) 75 MG tablet Take 75 mg by mouth daily.  . diazepam (VALIUM) 5 MG tablet Take 5 mg by mouth 2 (two) times daily as needed. For anxiety  . Ergocalciferol (VITAMIN D2 PO) Take 1 tablet by mouth every evening.   . folic acid (FOLVITE) 1 MG tablet Take 1 mg by mouth daily.  Marland Kitchen gabapentin (NEURONTIN) 100 MG capsule Take 100 mg by mouth 3 (three) times daily.  Marland Kitchen HYDROcodone-acetaminophen (NORCO) 10-325 MG tablet Take 2 tablets by mouth every 6 (six) hours as needed.  . Indacaterol-Glycopyrrolate 27.5-15.6 MCG CAPS Place 1 capsule into inhaler and inhale daily.  . misoprostol (CYTOTEC) 200 MCG tablet Take 200 mcg by mouth daily.  . nitroGLYCERIN (NITROSTAT) 0.4 MG SL tablet Place 0.4 mg under the tongue every 5 (five) minutes as needed. For chest pain  . pantoprazole (PROTONIX) 40 MG tablet Take 40 mg by mouth 2 (two) times daily.  . sennosides-docusate sodium (SENOKOT-S) 8.6-50 MG tablet Take 1 tablet by mouth daily as needed. For constipation  . sertraline (ZOLOFT) 50 MG tablet Take 50 mg by mouth daily.  Marland Kitchen terazosin (HYTRIN) 2 MG capsule Take 2 mg by mouth at bedtime.  . cetirizine (ZYRTEC) 10 MG tablet Take 10 mg by mouth daily.  . diclofenac (VOLTAREN) 75 MG EC tablet Take 75 mg by mouth every other day.   . Hydrocodone-Acetaminophen (VICODIN HP) 10-660 MG TABS Take 1-2 tablets by mouth every 4 (four) hours as needed. For pain   No facility-administered encounter medications on file as of 12/16/2016.     Functional Status:  In your present state of health, do you have any difficulty  performing the following activities: 12/16/2016  Hearing? Y  Vision? N  Difficulty concentrating or making decisions? N  Walking or climbing stairs? Y  Dressing or bathing? N  Doing errands, shopping? Y  Preparing Food and eating ? N  Using the Toilet? N  In the past six months, have you accidently leaked urine? N  Do you have problems with loss of bowel control? Y  Managing your Medications? N  Managing your Finances? N  Housekeeping or managing your Housekeeping? Y  Some recent data might be hidden    Fall/Depression Screening: PHQ 2/9 Scores 12/16/2016  PHQ - 2 Score 6  PHQ- 9 Score 16   Fall Risk  12/16/2016  Falls in the past year? No   Assessment: 76 year old with history of rheumatoid arthritis, HTN, Gastro-intestinal stromal tumor, COPD, left ear hearing loss.  Referral received per [employer] for assistance with oxygen equipment needs.  Client reports he [facility] oxygen [facility]. Client states they brought him a tank that weighs 20 pounds that last 20 minutes, adding he is unable to carry due to his rheumatoid arthritis. Client states that [facility] did not have the light weight oxygen type of oxgen needed.  Client reports he has found portable oxygen [employer] company that weighs 4-6 pounds and can be plugged in to charge while out or while driving.  Client reports he has been in contact with [employer].  RNCM discussed medications, client with no issues or problem managing medications.   Client acknowledges depression and reports he has been started on Sertraline for depression. Client is receptive to [place] work to assess for social work needs and assist with depression resources. Client acknowledges loss of appetite and weight loss of about 40 pounds over the past 6 months. Client states he has the food and is able to prepare, but has not had the appetite.  Client reports COPD. Client states he does not have a  pulmonologist. Client could benefit from pulmonary consult to assist with pulmonary/oxygen needs.  Fall risk- client denies any falls, but has a  history of rheumatoid arthritis, oxygen use/needs, recent weight loss, decreased nutritional intake.  RNCM provided contact number-encouraged to call as needed  RNCM provided 24hour nurse advice line number. Encouraged to call as needed.  Plan: home visit this week. [facility] social work consult.  Thea Silversmith, RN, MSN, [place] Coordinator Cell: [phone]

## 2016-12-18 ENCOUNTER — Other Ambulatory Visit: Payer: Self-pay

## 2016-12-18 ENCOUNTER — Encounter: Payer: Self-pay | Admitting: *Deleted

## 2016-12-18 NOTE — Patient Outreach (Signed)
[place] [facility]) Care Management   12/18/2016  Brett Scott 07-Aug-1941 [id number]  Brett Scott is an 76 y.o. male  Subjective: client reports difficulty with getting the light weight oxygen system.  Objective: BP 138/70   Pulse (!) 59   Resp 20   Ht 1.778 m (5' 10")   Wt 165 lb (74.8 kg) Comment: patient reported.  SpO2 96% Comment: sitting  BMI 23.68 kg/m    Review of Systems  Respiratory:       Breath sounds decreased.   Cardiovascular:       Heart sounds regular    Physical Exam  Encounter Medications:   Outpatient Encounter Prescriptions as of 12/18/2016  Medication Sig Note  . cetirizine (ZYRTEC) 10 MG tablet Take 10 mg by mouth daily.   . clopidogrel (PLAVIX) 75 MG tablet Take 75 mg by mouth daily.   . cycloSPORINE (RESTASIS) 0.05 % ophthalmic emulsion 1 drop 2 (two) times daily.   . diazepam (VALIUM) 5 MG tablet Take 5 mg by mouth 2 (two) times daily as needed. For anxiety   . folic acid (FOLVITE) 1 MG tablet Take 1 mg by mouth daily.   Marland Kitchen gabapentin (NEURONTIN) 100 MG capsule Take 100 mg by mouth 3 (three) times daily. 12/18/2016: Has 300 mg capsule-takes one capsule at bedtime.  Marland Kitchen HYDROcodone-acetaminophen (NORCO) 10-325 MG tablet Take 2 tablets by mouth every 6 (six) hours as needed.   . Indacaterol-Glycopyrrolate 27.5-15.6 MCG CAPS Place 1 capsule into inhaler and inhale daily.   . methotrexate (RHEUMATREX) 2.5 MG tablet Take 2.5 mg by mouth once a week. Caution:Chemotherapy. Protect from light. 12/18/2016: Takes 6 tablets once a week. Has 2.5 mg tablets.  . misoprostol (CYTOTEC) 200 MCG tablet Take 200 mcg by mouth daily. 12/18/2016: Takes twice a day.  . nitroGLYCERIN (NITROSTAT) 0.4 MG SL tablet Place 0.4 mg under the tongue every 5 (five) minutes as needed. For chest pain   . pantoprazole (PROTONIX) 40 MG tablet Take 40 mg by mouth 2 (two) times daily.   . sertraline (ZOLOFT) 50 MG tablet Take 50 mg by mouth daily.   Marland Kitchen terazosin (HYTRIN) 2 MG  capsule Take 2 mg by mouth at bedtime.   . clonazePAM (KLONOPIN) 1 MG tablet Take 1 mg by mouth daily.   . diclofenac (VOLTAREN) 75 MG EC tablet Take 75 mg by mouth every other day.    . Ergocalciferol (VITAMIN D2 PO) Take 1 tablet by mouth every evening.    Marland Kitchen Hydrocodone-Acetaminophen (VICODIN HP) 10-660 MG TABS Take 1-2 tablets by mouth every 4 (four) hours as needed. For pain   . sennosides-docusate sodium (SENOKOT-S) 8.6-50 MG tablet Take 1 tablet by mouth daily as needed. For constipation    No facility-administered encounter medications on file as of 12/18/2016.     Functional Status:   In your present state of health, do you have any difficulty performing the following activities: 12/16/2016  Hearing? Y  Vision? N  Difficulty concentrating or making decisions? N  Walking or climbing stairs? Y  Dressing or bathing? N  Doing errands, shopping? Y  Preparing Food and eating ? N  Using the Toilet? N  In the past six months, have you accidently leaked urine? N  Do you have problems with loss of bowel control? Y  Managing your Medications? N  Managing your Finances? N  Housekeeping or managing your Housekeeping? Y  Some recent data might be hidden    Fall/Depression Screening:  PHQ 2/9 Scores 12/16/2016  PHQ - 2 Score 6  PHQ- 9 Score 16   Fall Risk  12/16/2016  Falls in the past year? No   Assessment:  76 year old with history of rheumatoid arthritis, HTN, Gastro-intestinal stromal tumor, COPD, left ear hearing loss.  Referral received per [employer] for assistance with oxygen equipment needs.  RNCM completed home visit. Client reports his main issue today is that he is having difficulty getting a portable oxygen tank system that he can manage effectively. Client reports he currently is supplied oxygen thourgh Adult and pediatric services and states that the lightest portable tank system brought out to him is too heavy for him to carry and last only about 20 minutes  before he has to change out the system. Client reports interest in a system that weighs less than 6 pounds that can be charged easily. Brett Scott report she has been working with [employer] to obtain an oxygen system more appropriate for his condition.  RNCM called [facility]-Spoke with Brett Scott, who explained the process to [facility]. They can get a light weight concentrator system that is equivalent to the Portable Oxygen Concentrator system requested by client. But stated they need an order for an evaluation by the doctor and she was unsure if they had an order for the evaluation by the respiratory therapist. Adult Pediatrics to call RNCM back to inform if they have received an order to evaluate for a Portable oxygen concentrator.   History of COPD, pulmonary fibrosis. Client reports he was recently informed that he has COPD. Client is receptive to COPD education. Client may benefit from a  Referral to pulmonology.  Transportation resource need-client reports he may need surgery and is inquiring as to transportation resources to take him to and from the hospital. [facility] discussed Senior resources center as a resource to take to him to appointments. Unclear is they will pick someone up from a hospital stay. RNCM will make social work referral.  Client verbalized symptoms of depression. Client is agreeable to social work referral for resources.  Plan: Care Coordination-follow up with Adult and Pediatric specialty, COPD education. Follow up with client within the week with update.  Thea Silversmith, RN, MSN, [place] Coordinator Cell: [phone]

## 2016-12-19 ENCOUNTER — Other Ambulatory Visit: Payer: Self-pay

## 2016-12-19 NOTE — Patient Outreach (Signed)
[place] [facility]) Care Management  12/19/2016  Brett Scott 04/10/1941 [id number]  Care Coordination: RNCM received return call from Dr. Carmon Ginsberg with [facility]. Who is going to call Adult and Pediatric service(Durable Medical Equipment) company to begin the process for obtaining the oxygen system that is more functional for him to use.  Plan: update client.  Thea Silversmith, RN, MSN, [place] Coordinator Cell: [phone]

## 2016-12-20 ENCOUNTER — Other Ambulatory Visit: Payer: Self-pay

## 2016-12-20 NOTE — Patient Outreach (Signed)
[place] [facility]) Care Management  12/20/2016  Brett Scott January 03, 1941 [id number]   Care Coordination: RNCM called to update client. Informed that RNCM spoke with [facility]rina at primary care office and that she is coordinating directly with the equipment company to have a respiratory therapist evaluate client for portable oxygen concentrator.   Plan: Home visit scheduled in two weeks-education regarding COPD management.  Thea Silversmith, RN, MSN, [place] Coordinator Cell: [phone]

## 2016-12-24 ENCOUNTER — Other Ambulatory Visit: Payer: Self-pay | Admitting: *Deleted

## 2016-12-24 ENCOUNTER — Encounter: Payer: Self-pay | Admitting: *Deleted

## 2016-12-24 NOTE — Patient Outreach (Signed)
[place] [facility]) Care Management  12/24/2016  Brett Scott [birth year] [id number]   CSW was able to make initial contact with patient today to perform phone assessment, as well as assess and assist with social work needs and services.  CSW introduced self, explained role and types of services provided through [place] [facility] ([place] [facility]).  CSW further explained to patient that CSW works with patient's RNCM, also with [place] [facility], Brett Scott. CSW then explained the reason for the call, indicating that [name] thought that patient would benefit from social work services and resources to assist with referrals to various community agencies and resources.  CSW obtained two HIPAA compliant identifiers from patient, which included patient's name and date of birth. Patient appeared to be quite agitated today, voicing concerns and frustrations about not having received his portable oxygen tank yet, wanting to know why another individual, such as myself, needed to get involved.  Patient stated, "At this rate, I'll be dead before I'm ever approve for this oxygen".  Patient went on to say, "It makes no sense, I have a stationary tank and four portable tanks delivered to my home, but can't get approved for a light-weight tank".  Patient went on and on, for several minutes, about he believes that his home health agency are a "bunch of crooks, just trying to make a dollar".  Patient then proceeded to inform CSW that he is aware that doctors and pharmaceutical agencies have the cure for Diabetes, Cancer, Alzheimer's/Dementia, Parkinson's Disease, etc., but they will not come forward with the antidotes because the medical profession is a money making business. CSW inquired as to whether or not patient would be interested in counseling and/or supportive services, offering where appropriate, after discussing with patient his results of the PHQ 2 & 9 Depression  Scale, initially completed by Brett Scott, RNCM with [place] [facility].  Patient denied wanting counseling services or even needing a referral to various community agencies and resources.  Patient stated, "All I need is my portable light weight oxygen machine, why doesn't anybody get that?"  CSW voiced understanding, agreeing to converse with [name] to check the status of the durable medical equipment.  CSW also performed a thorough review of patient's EMR (Electronic Medical Record) in [employer], learning that patient's Primary Care Physician, Brett Scott and his office staff, Brett Scott are diligently working on trying to get patient evaluated for approval of the device.  CSW explained to patient that he scheduled to be evaluated by a respiratory therapist, within the next week, but patient only wanted to disagree with CSW, explaining that he has already been approved by Dr. Inda Scott. CSW then inquired as to whether or not patient was interested in having CSW make a referral for him to Brett Scott through [employer] [facility] [place].  Again, patient denied, reporting "I am perfecting capable of preparing my own meals, I just don't eat because I don't have an appetite, and I don't have an appetite because I don't do anything all day but sit around because I can't carry the current portable tank around with me because it is too heavy".  Patient went on to say that he suffers from chronic arthritis pain and that he is in need of shoulder surgery.  Patient apologized to CSW for "being so rude" throughout the conversation, but admitted that the only thing he is interested in receiving from anyone at this point is his portable  light weight oxygen tank.  CSW agreed to follow-up with [name]. CSW will perform a case closure on patient, as all goals of treatment have been met from social work standpoint and no additional social work needs have been identified at this time.  CSW will  notify patient's RNCM with [place] [facility], Brett Scott of CSW's plans to close patient's case.  CSW will fax an update to patient's Primary Care Physician, Brett Scott to ensure that they are aware of CSW's involvement with patient's plan of care.  CSW will submit a case closure request to Verlon Setting, Care Management Assistant with [place] [facility], in the form of an [employer].  CSW will ensure that Brett Scott is aware of Brett Scott, RNCM with [place] [facility], continued involvement with patient's care. Brett Scott, BSW, MSW, LCSW  Licensed Education officer, [facility]  [address] [place] [address] [place], [place] [address] Physical Address-[address], [place], [place] [address] Toll Free Main # [phone] Fax # [phone] Cell # [phone]  Office # [phone] [email or web address]

## 2016-12-27 DIAGNOSIS — J449 Chronic obstructive pulmonary disease, unspecified: Secondary | ICD-10-CM | POA: Diagnosis not present

## 2016-12-31 ENCOUNTER — Other Ambulatory Visit: Payer: Self-pay

## 2016-12-31 NOTE — Patient Outreach (Signed)
[facility]) Care Management  12/31/2016  Brett Scott [birth year] [id number]   Subjective: "I was calling to see when you were coming back out and I haven't heard anything..."  Objective: none-telephonic call  Assessment: 76 year old with history of rheumatoid arthritis, HTN, Gastro-intestinal stromal tumor, COPD, left ear hearing loss.  Referral received per [employer] for assistance with oxygen equipment needs.  Care Coordination-RNCM returned call to client. Brett Scott informed [place] home visit is scheduled for tomorrow. RNCM also informed client that respiratory therapist was going to come from [facility] Specialist regarding assessment of portable oxygen concentrator. Client was in agreement for both to come out at the same time.  RNCM called Adult and Pediatric specialist and confirmed home visit with client.Also informed that client is expecting both RNCM and respiratory therapist.  Plan: home visit tomorrow.  Thea Silversmith, RN, MSN, [place] Coordinator Cell: [phone]

## 2016-12-31 NOTE — Patient Outreach (Addendum)
[place] [facility]) Care Management  12/31/2016  Brett Scott 1941-01-19 [id number]  Care Coordination: 76 year old with history of rheumatoid arthritis, HTN, Gastro-intestinal stromal tumor, COPD, left ear hearing loss.  Referral received per [employer] for assistance with oxygen equipment needs.  RNCM called Adult and Pediatric specialist to follow up on portable oxygen concentrator. Spoke with [name] who checked and reports they have the order needed. She checked with respiratory therapist who states she will be able to make visit tomorrow when [facility] is scheduled to see client.  Plan: co-visit with respiratory therapist from Adult and Pediatric specialist.  Thea Silversmith, RN, MSN, [place] Coordinator Cell: [phone]

## 2017-01-01 ENCOUNTER — Other Ambulatory Visit: Payer: Self-pay

## 2017-01-01 NOTE — Patient Outreach (Signed)
[place] [facility]) Care Management   01/01/2017  Brett Scott 18-Jul-1941 [id number]  Brett Scott is an 76 y.o. male  Subjective: reports having pain today due to weather, but reports relief with medication.  Objective:  BP 122/70   Pulse (!) 59   SpO2 92% Comment: room air per respiratory therapist.  Review of Systems  Respiratory:       Lungs decreased, shortness of breath noted following testing per respiratory therapist. Breathing returned to normal at rest.  Cardiovascular:       Heart rate regular    Physical Exam  Encounter Medications:   Outpatient Encounter Prescriptions as of 01/01/2017  Medication Sig Note  . cetirizine (ZYRTEC) 10 MG tablet Take 10 mg by mouth daily.   . clonazePAM (KLONOPIN) 1 MG tablet Take 1 mg by mouth daily.   . clopidogrel (PLAVIX) 75 MG tablet Take 75 mg by mouth daily.   . cycloSPORINE (RESTASIS) 0.05 % ophthalmic emulsion 1 drop 2 (two) times daily.   . diazepam (VALIUM) 5 MG tablet Take 5 mg by mouth 2 (two) times daily as needed. For anxiety   . diclofenac (VOLTAREN) 75 MG EC tablet Take 75 mg by mouth every other day.    . Ergocalciferol (VITAMIN D2 PO) Take 1 tablet by mouth every evening.    . folic acid (FOLVITE) 1 MG tablet Take 1 mg by mouth daily.   Marland Kitchen gabapentin (NEURONTIN) 100 MG capsule Take 100 mg by mouth 3 (three) times daily. 12/18/2016: Has 300 mg capsule-takes one capsule at bedtime.  Marland Kitchen HYDROcodone-acetaminophen (NORCO) 10-325 MG tablet Take 2 tablets by mouth every 6 (six) hours as needed.   . Hydrocodone-Acetaminophen (VICODIN HP) 10-660 MG TABS Take 1-2 tablets by mouth every 4 (four) hours as needed. For pain   . Indacaterol-Glycopyrrolate 27.5-15.6 MCG CAPS Place 1 capsule into inhaler and inhale daily.   . methotrexate (RHEUMATREX) 2.5 MG tablet Take 2.5 mg by mouth once a week. Caution:Chemotherapy. Protect from light. 12/18/2016: Takes 6 tablets once a week. Has 2.5 mg tablets.  . misoprostol (CYTOTEC) 200  MCG tablet Take 200 mcg by mouth daily. 12/18/2016: Takes twice a day.  . nitroGLYCERIN (NITROSTAT) 0.4 MG SL tablet Place 0.4 mg under the tongue every 5 (five) minutes as needed. For chest pain   . pantoprazole (PROTONIX) 40 MG tablet Take 40 mg by mouth 2 (two) times daily.   . sennosides-docusate sodium (SENOKOT-S) 8.6-50 MG tablet Take 1 tablet by mouth daily as needed. For constipation   . sertraline (ZOLOFT) 50 MG tablet Take 50 mg by mouth daily.   Marland Kitchen terazosin (HYTRIN) 2 MG capsule Take 2 mg by mouth at bedtime.    No facility-administered encounter medications on file as of 01/01/2017.     Functional Status:   In your present state of health, do you have any difficulty performing the following activities: 12/24/2016 12/16/2016  Hearing? Tempie Donning  Vision? N N  Difficulty concentrating or making decisions? N N  Walking or climbing stairs? Y Y  Dressing or bathing? N N  Doing errands, shopping? Tempie Donning  Preparing Food and eating ? N N  Using the Toilet? N N  In the past six months, have you accidently leaked urine? N N  Do you have problems with loss of bowel control? Y Y  Managing your Medications? N N  Managing your Finances? N N  Housekeeping or managing your Housekeeping? Y Y  Some recent data might be hidden  Fall/Depression Screening:    PHQ 2/9 Scores 12/24/2016 12/16/2016  PHQ - 2 Score 2 6  PHQ- 9 Score 8 16   Fall Risk  12/24/2016 12/16/2016  Falls in the past year? No No   Assessment:  76 year old with history of rheumatoid arthritis, HTN, Gastro-intestinal stromal tumor, COPD, left ear hearing loss.  Referral received per [employer] for assistance with oxygen equipment needs.   Home visit completed. Adult and Pediatric specialist respiratory therapist present to assess client for pulse dosed-breath initiated.  COPD education: Discussed during visit 1) how to check to see if oxygen tube is still blowing air 2) how the pulse dosed oxygen system works by breathing in  through nose 3) encouraged client to follow up in a month if he has not received a call regarding portable oxygen concentrator 4) COPD action plan reviewed EMMI articles provided.  Client to call RNCM when he hears from Adult and Pediatric specialist regarding the portable oxygen system.  Client encouraged to call RNCM as needed. Client encouraged to call 24 hour nurse advice line as needed.   Plan: follow up telephonically in a month, continue COPD education.  Thea Silversmith, RN, MSN, [place] Coordinator Cell: [phone]

## 2017-01-16 DIAGNOSIS — Z79899 Other long term (current) drug therapy: Secondary | ICD-10-CM | POA: Diagnosis not present

## 2017-01-16 DIAGNOSIS — M0609 Rheumatoid arthritis without rheumatoid factor, multiple sites: Secondary | ICD-10-CM | POA: Diagnosis not present

## 2017-01-16 DIAGNOSIS — Z6824 Body mass index (BMI) 24.0-24.9, adult: Secondary | ICD-10-CM | POA: Diagnosis not present

## 2017-01-16 DIAGNOSIS — M2550 Pain in unspecified joint: Secondary | ICD-10-CM | POA: Diagnosis not present

## 2017-01-16 DIAGNOSIS — M5116 Intervertebral disc disorders with radiculopathy, lumbar region: Secondary | ICD-10-CM | POA: Diagnosis not present

## 2017-01-17 ENCOUNTER — Other Ambulatory Visit: Payer: Self-pay

## 2017-01-17 NOTE — Patient Outreach (Signed)
[place] [facility]) Care Management  01/17/2017  MOHANNAD OLIVERO Nov 28, 1940 [id number]   Care Coordination: RNCM called Adult and Pediatric specialist regarding status of portable oxygen concentrator-spoke with representative John. Representative states that client called them on yesterday and that client was informed that his concentrator had been approved and was shipped. He also states that the portable concentrator arrived yesterday and is being delivered today.  Plan: RNCM will follow up with member to address any additional issues or concerns.  Thea Silversmith, RN, MSN, [place] Coordinator Cell: [phone]

## 2017-01-17 NOTE — Patient Outreach (Signed)
[place] [facility]) Care Management  01/17/2017  CHRISTIAAN STREBECK [birth year] [id number]  Subjective: "I hadn't heard from no one, the last I heard they came out to the house, did that test, they did not even tell me if I qualified... So I called them and they couldn't tell me anything. I called everybody yesterday". Client adds someone from the insurance company called to find out if it was going to be approved. "I did not know". I called the insurance company back and they told me she got some type of promotion and is not there anymore and she was not on my case anymore". As the conversation went on client acknowledged that Adult and Pediatric specialist informed him that it was ordered and would be in today or tomorrow ".  Objective: none-telephonic call  Assessment: 76 year old with history of rheumatoid arthritis, HTN, Gastro-intestinal stromal tumor, COPD, left ear hearing loss.  Referral received per [employer] for assistance with oxygen equipment needs.  RNCM called to follow up regarding status of portable oxygen concentrator. Client initially stated he had not heard anything from [place] regarding rather he was "even eligible or not". RNCM reiterated that the respiratory therapist informed him while she was at his house that he passed the test and was eligible to get portable oxygen concentrator and that it would be ordered and it would take at least a month to come in.  RNCM reiterated the plan developed by both client and RNCM was for him to follow up in a month if he had not heard anything from the equipment company. Client acknowledged that he knew it would take a month, adding that is why he called on yesterday.  RNCM reinforced that Adult [facility] Pediatric Specialist informed RNCM that the concentrator is in and will be delivered today. Client states no one has called to schedule this yet and he will not be in all day today.  RNCM confirmed that he has  RNCM's contact number, called RNCM on yesterday, but did not leave a message. Client states he will leave a message next time.  RNCM called Adult and Pediatric specialist and requested they call him as soon as possible to schedule a delivery time for today.  Plan: Home visit scheduled for next week. Follow up regarding equipment needs, follow up COPD education.  Thea Silversmith, RN, MSN, [place] Coordinator Cell: [phone]

## 2017-01-21 ENCOUNTER — Other Ambulatory Visit: Payer: Self-pay

## 2017-01-21 NOTE — Patient Outreach (Signed)
[place] [facility]) Care Management   01/21/2017  Brett Scott 02-21-1941 [id number]  Brett Scott is an 76 y.o. male  Subjective: client with complaints that portable oxygen concentrator was not working right and reports a no flow alarm.  Objective:  BP 140/74   Pulse 78   Resp 20   Ht 1.778 m (_0 )   Wt 169 lb (76.7 kg)   SpO2 94%   BMI 24.25 kg/m  Saturation 94% on room air, sitting. Review of Systems  Respiratory:       Respirations even unlabored, decreased breath sound.  Cardiovascular:       Heart sounds regular    Physical Exam BP 140/74   Pulse 78   Resp 20   Ht 1.778 m (_1 )   Wt 169 lb (76.7 kg)   SpO2 94%   BMI 24.25 kg/m    Encounter Medications:   Outpatient Encounter Prescriptions as of 01/21/2017  Medication Sig Note  . cetirizine (ZYRTEC) 10 MG tablet Take 10 mg by mouth daily.   . clonazePAM (KLONOPIN) 1 MG tablet Take 1 mg by mouth daily.   . clopidogrel (PLAVIX) 75 MG tablet Take 75 mg by mouth daily.   . cycloSPORINE (RESTASIS) 0.05 % ophthalmic emulsion 1 drop 2 (two) times daily.   . diazepam (VALIUM) 5 MG tablet Take 5 mg by mouth 2 (two) times daily as needed. For anxiety   . diclofenac (VOLTAREN) 75 MG EC tablet Take 75 mg by mouth every other day.    . Ergocalciferol (VITAMIN D2 PO) Take 1 tablet by mouth every evening.    . folic acid (FOLVITE) 1 MG tablet Take 1 mg by mouth daily.   Marland Kitchen gabapentin (NEURONTIN) 100 MG capsule Take 100 mg by mouth 3 (three) times daily. 12/18/2016: Has 300 mg capsule-takes one capsule at bedtime.  Marland Kitchen HYDROcodone-acetaminophen (NORCO) 10-325 MG tablet Take 2 tablets by mouth every 6 (six) hours as needed.   . Indacaterol-Glycopyrrolate 27.5-15.6 MCG CAPS Place 1 capsule into inhaler and inhale daily.   . methotrexate (RHEUMATREX) 2.5 MG tablet Take 2.5 mg by mouth once a week. Caution:Chemotherapy. Protect from light. 12/18/2016: Takes 6 tablets once a week. Has 2.5 mg tablets.  . misoprostol  (CYTOTEC) 200 MCG tablet Take 200 mcg by mouth daily. 12/18/2016: Takes twice a day.  . nitroGLYCERIN (NITROSTAT) 0.4 MG SL tablet Place 0.4 mg under the tongue every 5 (five) minutes as needed. For chest pain   . pantoprazole (PROTONIX) 40 MG tablet Take 40 mg by mouth 2 (two) times daily.   . sennosides-docusate sodium (SENOKOT-S) 8.6-50 MG tablet Take 1 tablet by mouth daily as needed. For constipation   . sertraline (ZOLOFT) 50 MG tablet Take 50 mg by mouth daily.   Marland Kitchen terazosin (HYTRIN) 2 MG capsule Take 2 mg by mouth at bedtime.   Marland Kitchen Hydrocodone-Acetaminophen (VICODIN HP) 10-660 MG TABS Take 1-2 tablets by mouth every 4 (four) hours as needed. For pain    No facility-administered encounter medications on file as of 01/21/2017.     Functional Status:   In your present state of health, do you have any difficulty performing the following activities: 12/24/2016 12/16/2016  Hearing? Tempie Donning  Vision? N N  Difficulty concentrating or making decisions? N N  Walking or climbing stairs? Y Y  Dressing or bathing? N N  Doing errands, shopping? Tempie Donning  Preparing Food and eating ? N N  Using the Toilet? N N  In the past six months, have you accidently leaked urine? N N  Do you have problems with loss of bowel control? Y Y  Managing your Medications? N N  Managing your Finances? N N  Housekeeping or managing your Housekeeping? Y Y  Some recent data might be hidden    Fall/Depression Screening:    Fall Risk  12/24/2016 12/16/2016  Falls in the past year? No No   PHQ 2/9 Scores 12/24/2016 12/16/2016  PHQ - 2 Score 2 6  PHQ- 9 Score 8 16    Assessment:  76 year old with history of rheumatoid arthritis, HTN, Gastro-intestinal stromal tumor, COPD, left ear hearing loss.  Referral received per [employer] for assistance with oxygen equipment needs.   Client received [employer]-simply go. Client reports he went to mailbox yesterday and had difficulty getting air and increased the number to  5. Client also reports getting an no flow warning. RNCM reinforced with client it the oxygen flow is activated through breathing through his nose and he must breath through his nose to receive the oxygen. RNCM reviewed manual for no flow alarm. RNCM reinforced the meaning is a kinked tubing.  Client reports he has used it on other outings.  RNCM reinforced the importance of oxygen and using it as ordered.  Client reports he went to rheumatologist office visit but did not use it in the rheumatologist office. RNCM reinforced the importance of oxygen and using it as ordered.  Socialization-reports spoke of being lonely in his home. Expressed loss of his wife, loss his golfing buddies when his wife was sick. Client expressed desire for more socialization. RNCM informed client of organizations where client could socialize, discussed [place] [facility], [employer], [employer], site. RNCM also discussed possibly accessing transportation to MD appointment through senior resource center that would enable client to conserve energy. Client is not receptive at this time. RNCM encouraged client to call RNCM if he changes his mind. RNCM also provided [facility] social worker's name and contact information, Nat Christen.    Noted-When RNCM was leaving, client walked out to mailbox without oxygen. When RNCM asked about getting his portable oxygen concentrator, client stated, it was quicker.  Plan: follow up with Adult and Pediatric specialist, follow up call within the next 2-3 weeks.  Thea Silversmith, RN, MSN, [place] Coordinator Cell: [phone]

## 2017-01-22 ENCOUNTER — Other Ambulatory Visit: Payer: Self-pay

## 2017-01-22 NOTE — Patient Outreach (Signed)
[place] [facility]) Care Management  01/22/2017  Brett Scott 12-Nov-1940 [id number]  Care Coordination: RNCM called Adult and Pediatric Specialist-spoke with Levada Dy respiratory therapist and informed her of clients concern that he was not getting oxygen. Also informed her that client changed the number from 4 to 5. Request she go over use of the portable oxygen concentrator system and alarms.  Plan: continue to follow.  Thea Silversmith, RN, MSN, [place] Coordinator Cell: [phone]

## 2017-01-26 DIAGNOSIS — J449 Chronic obstructive pulmonary disease, unspecified: Secondary | ICD-10-CM | POA: Diagnosis not present

## 2017-01-27 ENCOUNTER — Ambulatory Visit: Payer: Self-pay

## 2017-02-04 ENCOUNTER — Other Ambulatory Visit: Payer: Self-pay

## 2017-02-04 NOTE — Patient Outreach (Signed)
[place] [facility]) Care Management  02/04/2017  Brett Scott 1941-06-05 [id number]     Subjective: regarding portable oxygen concentrator-"it is better". Regarding COPD education-" I would like to know more about it. I am pretty much the same. I don't cough at all".   Objective: none (telephonic call)  Assessment: 76 year old with history of rheumatoid arthritits, HTN, Gastro-intestinal stromal tumor, COPD, left ear hearing loss.  Referral received per [employer] for assistance with oxygen equipment needs.  Client with questions regarding diagnosis listed on his after visit summary from primary care's office, referring to stomach tumor and other diagnosis. RNCM encouraged client to take that summary with him to his next appointment (per client in June) to ask his specific questions so he can get better clarification of his diagnoses and other questions. Client stated he would do that.  Client reports he has used his portable oxygen concentrator when out running errands. Client expressing no difficulty with use. He reports, by the time he returned home he was almost out of battery and adds there is a battery life indicator on the concentrator so he is aware of how much battery time he has left. Client also expressed that he has an extra battery also that he can take with him. RNCM reinforced there may be some adjustments he has to be make in his routine in terms of time away from home or distance he has to walk when carrying the tank so it wont become too heavy for him.  Client reports he went out to chop around a big bush without his oxygen and reports he was not able to do much without getting short of breath and having to go back in the house to rest. RNCM reinforced the importance of using oxygen especially with activity.   Re: COPD education-Client is receptive to being transferred to the health coach for more education regarding COPD.   Plan: transfer to health  coach for continued COPD education.  Thea Silversmith, RN, MSN, [place] Coordinator Cell: [phone]

## 2017-02-06 ENCOUNTER — Other Ambulatory Visit: Payer: Self-pay

## 2017-02-06 NOTE — Patient Outreach (Signed)
[place] [facility]) Care Management  02/06/2017  SOLOMAN MCKEITHAN Oct 26, 1940 [id number]   Telephone call to patient for introductory call.  Discussed with patient health coach role.  Patient verbalized understanding.  Patient offers no concerns.  Plan:RN Health Coach will contact patient in the month of June and patient agrees to next outreach.  Jone Baseman, RN, MSN [place] [phone]

## 2017-02-20 DIAGNOSIS — H40013 Open angle with borderline findings, low risk, bilateral: Secondary | ICD-10-CM | POA: Diagnosis not present

## 2017-02-20 DIAGNOSIS — H35041 Retinal micro-aneurysms, unspecified, right eye: Secondary | ICD-10-CM | POA: Diagnosis not present

## 2017-02-20 DIAGNOSIS — I708 Atherosclerosis of other arteries: Secondary | ICD-10-CM | POA: Diagnosis not present

## 2017-02-20 DIAGNOSIS — H35031 Hypertensive retinopathy, right eye: Secondary | ICD-10-CM | POA: Diagnosis not present

## 2017-02-24 DIAGNOSIS — R0902 Hypoxemia: Secondary | ICD-10-CM | POA: Diagnosis not present

## 2017-02-24 DIAGNOSIS — E782 Mixed hyperlipidemia: Secondary | ICD-10-CM | POA: Diagnosis not present

## 2017-02-24 DIAGNOSIS — J449 Chronic obstructive pulmonary disease, unspecified: Secondary | ICD-10-CM | POA: Diagnosis not present

## 2017-02-24 DIAGNOSIS — M546 Pain in thoracic spine: Secondary | ICD-10-CM | POA: Diagnosis not present

## 2017-02-24 DIAGNOSIS — J8410 Pulmonary fibrosis, unspecified: Secondary | ICD-10-CM | POA: Diagnosis not present

## 2017-02-24 DIAGNOSIS — R0609 Other forms of dyspnea: Secondary | ICD-10-CM | POA: Diagnosis not present

## 2017-02-26 DIAGNOSIS — J449 Chronic obstructive pulmonary disease, unspecified: Secondary | ICD-10-CM | POA: Diagnosis not present

## 2017-03-03 ENCOUNTER — Other Ambulatory Visit: Payer: Self-pay

## 2017-03-03 NOTE — Patient Outreach (Signed)
[place] [facility]) Care Management  [place]  03/03/2017   Brett Scott 06-07-1941 [id number]  Subjective: Telephone call to patient for initial assessment.  Patient reports he is doing ok but expressed some unhappiness with his portable oxygen.  Patient really wants the inogen portable oxygen but the company he uses has not given it to him as they gave him something similar.  Advised patient that he could send that back if he were unhappy with it and use the regular portable tanks.  He states he preferred not to. Patient reports that his breathing is ok and that he gets winded with activity. He reports that he just rests when that happens. Discussed with patient COPD action plan and when to notify physician.  He verbalized understanding. Patient reports that he has some arthritis for which he takes hydrocodone. Patient lives alone and that he is independent with activities of daily living.   Objective:   Encounter Medications:  Outpatient Encounter Prescriptions as of 03/03/2017  Medication Sig Note  . cetirizine (ZYRTEC) 10 MG tablet Take 10 mg by mouth daily.   . clonazePAM (KLONOPIN) 1 MG tablet Take 1 mg by mouth daily.   . clopidogrel (PLAVIX) 75 MG tablet Take 75 mg by mouth daily.   . cycloSPORINE (RESTASIS) 0.05 % ophthalmic emulsion 1 drop 2 (two) times daily.   . diazepam (VALIUM) 5 MG tablet Take 5 mg by mouth 2 (two) times daily as needed. For anxiety   . diclofenac (VOLTAREN) 75 MG EC tablet Take 75 mg by mouth every other day.    . Ergocalciferol (VITAMIN D2 PO) Take 1 tablet by mouth every evening.    . folic acid (FOLVITE) 1 MG tablet Take 1 mg by mouth daily.   Marland Kitchen gabapentin (NEURONTIN) 100 MG capsule Take 100 mg by mouth 3 (three) times daily. 12/18/2016: Has 300 mg capsule-takes one capsule at bedtime.  Marland Kitchen HYDROcodone-acetaminophen (NORCO) 10-325 MG tablet Take 2 tablets by mouth every 6 (six) hours as needed.   . Indacaterol-Glycopyrrolate 27.5-15.6 MCG  CAPS Place 1 capsule into inhaler and inhale daily.   . methotrexate (RHEUMATREX) 2.5 MG tablet Take 2.5 mg by mouth once a week. Caution:Chemotherapy. Protect from light. 12/18/2016: Takes 6 tablets once a week. Has 2.5 mg tablets.  . misoprostol (CYTOTEC) 200 MCG tablet Take 200 mcg by mouth daily. 12/18/2016: Takes twice a day.  . nitroGLYCERIN (NITROSTAT) 0.4 MG SL tablet Place 0.4 mg under the tongue every 5 (five) minutes as needed. For chest pain   . pantoprazole (PROTONIX) 40 MG tablet Take 40 mg by mouth 2 (two) times daily.   . sennosides-docusate sodium (SENOKOT-S) 8.6-50 MG tablet Take 1 tablet by mouth daily as needed. For constipation   . sertraline (ZOLOFT) 50 MG tablet Take 50 mg by mouth daily.   Marland Kitchen terazosin (HYTRIN) 2 MG capsule Take 2 mg by mouth at bedtime.   Marland Kitchen Hydrocodone-Acetaminophen (VICODIN HP) 10-660 MG TABS Take 1-2 tablets by mouth every 4 (four) hours as needed. For pain    No facility-administered encounter medications on file as of 03/03/2017.     Functional Status:  In your present state of health, do you have any difficulty performing the following activities: 03/03/2017 12/24/2016  Hearing? Tempie Donning  Vision? N N  Difficulty concentrating or making decisions? N N  Walking or climbing stairs? N Y  Dressing or bathing? N N  Doing errands, shopping? N Y  Conservation officer, nature and eating ? N N  Using the Toilet? N N  In the past six months, have you accidently leaked urine? N N  Do you have problems with loss of bowel control? N Y  Managing your Medications? N N  Managing your Finances? N N  Housekeeping or managing your Housekeeping? N Y  Some recent data might be hidden    Fall/Depression Screening: Fall Risk  03/03/2017 12/24/2016 12/16/2016  Falls in the past year? No No No   PHQ 2/9 Scores 03/03/2017 12/24/2016 12/16/2016  PHQ - 2 Score _0 PHQ- 9 Score - 8 16    Assessment: Patient will benefit from health coach outreach for disease management and  support.  Plan:  [facility] CM Care Plan Problem Two     Most Recent Value  Care Plan Problem Two  knowledge defict related to COPD management.  Role Documenting the Problem Two  [place] for Problem Two  Active  Interventions for Problem Two Long Term Goal   RN Health Coach discussed the importance of oxygen use, discussed copd action plan.  [place] Term Goal  client will verbalize at least three strategies for COPD managment within the next 90 days.  [facility] Long Term Goal Start Date  03/03/17 [facility] continued]     [place] will provide ongoing education for patient on COPD through phone calls and sending printed information to patient for further discussion.  RN Health Coach will sent welcome letter. RN Health Coach will send initial barriers letter, assessment, and care plan to primary care physician.  RN Health Coach will contact patient in the month of July and patient agrees to next outreach.

## 2017-03-19 ENCOUNTER — Other Ambulatory Visit: Payer: Self-pay

## 2017-03-19 NOTE — Patient Outreach (Signed)
[place] [facility]) Care Management  03/19/2017  Brett Scott 08-20-1941 [id number]   Telephone call to patient for monthly call.  Patient reports that he cannot talk right now as he has to show his home today.  Advised patient that health coach will call back another time.  Plan: RN Health Coach will attempt patient again in the month of July.  Jone Baseman, RN, MSN [place] [phone]

## 2017-03-31 ENCOUNTER — Other Ambulatory Visit: Payer: Self-pay

## 2017-03-31 NOTE — Patient Outreach (Signed)
[place] [facility]) Care Management  [place]  03/31/2017   Brett Scott 15-Jun-1941 [id number]  Subjective: Telephone call to patient for monthly call.  Patient reports he is slow this morning just awakening.  Patient reports that his home is under contract.  Asked patient if he had plans for moving once deal is complete.  He states he is looking to move to [place].  He states that his realtor is willing to help him with getting house cleaned out and his things stored until he moves.  Patient reports that he has some problems at times with his hernia and that he cannot have surgery right now.  Patient has ongoing joint pain from arthritis and takes his pain medications as needed.  Patient reports that his COPD bothers him some but is able to rest when he does get short of breath.  Reviewed with patient symptoms of COPD and when to seek help.  He verbalized understanding.    Objective:   Encounter Medications:  Outpatient Encounter Prescriptions as of 03/31/2017  Medication Sig Note  . cetirizine (ZYRTEC) 10 MG tablet Take 10 mg by mouth daily.   . clonazePAM (KLONOPIN) 1 MG tablet Take 1 mg by mouth daily.   . clopidogrel (PLAVIX) 75 MG tablet Take 75 mg by mouth daily.   . cycloSPORINE (RESTASIS) 0.05 % ophthalmic emulsion 1 drop 2 (two) times daily.   . diazepam (VALIUM) 5 MG tablet Take 5 mg by mouth 2 (two) times daily as needed. For anxiety   . diclofenac (VOLTAREN) 75 MG EC tablet Take 75 mg by mouth every other day.    . Ergocalciferol (VITAMIN D2 PO) Take 1 tablet by mouth every evening.    . folic acid (FOLVITE) 1 MG tablet Take 1 mg by mouth daily.   Marland Kitchen gabapentin (NEURONTIN) 100 MG capsule Take 100 mg by mouth 3 (three) times daily. 12/18/2016: Has 300 mg capsule-takes one capsule at bedtime.  Marland Kitchen HYDROcodone-acetaminophen (NORCO) 10-325 MG tablet Take 2 tablets by mouth every 6 (six) hours as needed.   . methotrexate (RHEUMATREX) 2.5 MG tablet Take 2.5 mg by mouth  once a week. Caution:Chemotherapy. Protect from light. 12/18/2016: Takes 6 tablets once a week. Has 2.5 mg tablets.  . misoprostol (CYTOTEC) 200 MCG tablet Take 200 mcg by mouth daily. 12/18/2016: Takes twice a day.  . nitroGLYCERIN (NITROSTAT) 0.4 MG SL tablet Place 0.4 mg under the tongue every 5 (five) minutes as needed. For chest pain   . pantoprazole (PROTONIX) 40 MG tablet Take 40 mg by mouth 2 (two) times daily.   . sennosides-docusate sodium (SENOKOT-S) 8.6-50 MG tablet Take 1 tablet by mouth daily as needed. For constipation   . sertraline (ZOLOFT) 50 MG tablet Take 50 mg by mouth daily.   Marland Kitchen terazosin (HYTRIN) 2 MG capsule Take 2 mg by mouth at bedtime.   Marland Kitchen Hydrocodone-Acetaminophen (VICODIN HP) 10-660 MG TABS Take 1-2 tablets by mouth every 4 (four) hours as needed. For pain   . Indacaterol-Glycopyrrolate 27.5-15.6 MCG CAPS Place 1 capsule into inhaler and inhale daily.    No facility-administered encounter medications on file as of 03/31/2017.     Functional Status:  In your present state of health, do you have any difficulty performing the following activities: 03/03/2017 12/24/2016  Hearing? Tempie Donning  Vision? N N  Difficulty concentrating or making decisions? N N  Walking or climbing stairs? N Y  Dressing or bathing? N N  Doing errands, shopping? Aggie Moats  Preparing Food and eating ? N N  Using the Toilet? N N  In the past six months, have you accidently leaked urine? N N  Do you have problems with loss of bowel control? N Y  Managing your Medications? N N  Managing your Finances? N N  Housekeeping or managing your Housekeeping? N Y  Some recent data might be hidden    Fall/Depression Screening: Fall Risk  03/31/2017 03/03/2017 12/24/2016  Falls in the past year? No No No   PHQ 2/9 Scores 03/31/2017 03/03/2017 12/24/2016 12/16/2016  PHQ - 2 Score 0 _0 PHQ- 9 Score - - 8 16    Assessment: Patient continues to benefit from health coach outreach for disease management and support.    Plan:  [facility] CM Care Plan Problem Two     Most Recent Value  Care Plan Problem Two  knowledge defict related to COPD management.  Role Documenting the Problem Two  [place] for Problem Two  Active  Interventions for Problem Two Long Term Goal   RN Health Coach reviewed the importance of oxygen use, discussed copd action plan.  [place] Term Goal  client will verbalize at least three strategies for COPD managment within the next 90 days.  [facility] Long Term Goal Start Date  03/31/17 [facility] continued]     [place] will contact patient in the month of August and patient agrees to next outreach.  Jone Baseman, RN, MSN [place] [phone]

## 2017-04-24 ENCOUNTER — Other Ambulatory Visit: Payer: Self-pay

## 2017-04-24 DIAGNOSIS — J8410 Pulmonary fibrosis, unspecified: Secondary | ICD-10-CM | POA: Diagnosis not present

## 2017-04-24 DIAGNOSIS — J449 Chronic obstructive pulmonary disease, unspecified: Secondary | ICD-10-CM | POA: Diagnosis not present

## 2017-04-24 DIAGNOSIS — R0902 Hypoxemia: Secondary | ICD-10-CM | POA: Diagnosis not present

## 2017-04-24 DIAGNOSIS — M546 Pain in thoracic spine: Secondary | ICD-10-CM | POA: Diagnosis not present

## 2017-04-24 DIAGNOSIS — S61419A Laceration without foreign body of unspecified hand, initial encounter: Secondary | ICD-10-CM | POA: Diagnosis not present

## 2017-04-24 NOTE — Patient Outreach (Signed)
[place] [facility]) Care Management  04/24/2017  Brett Scott 22-Nov-1940 [id number]   Telephone call to patient for monthly call. Phone constantly busy after numerous tries.   Plan: RN Health coach will attempt patient again in the month of August.   Sender Rueb J Jonai Weyland, RN, MSN [place] [phone]

## 2017-05-06 ENCOUNTER — Other Ambulatory Visit: Payer: Self-pay

## 2017-05-06 NOTE — Patient Outreach (Signed)
[place] [facility]) Care Management  05/06/2017  Brett Scott 1941-06-27 [id number]   2nd Telephone call to patient for monthly call.  Main number busy again.  Cell number not accepting incoming calls.    Plan: RN Health Coach will attempt patient again in the month of August.    Gilmore List J [name], RN, MSN [place] [phone]

## 2017-05-09 ENCOUNTER — Other Ambulatory Visit: Payer: Self-pay

## 2017-05-09 NOTE — Patient Outreach (Signed)
[place] [facility]) Care Management  05/09/2017  CLARANCE BOLLARD November 08, 1940 [id number]   3rd telephone call to patient for monthly call.  Home number fast busy, cell number left HIPAA compliant voice message.    Plan: RN Health Coach will send letter to attempt outreach.  If no response within ten business days will proceed with case closure.   Jone Baseman, RN, MSN [place] [phone]

## 2017-05-28 ENCOUNTER — Other Ambulatory Visit: Payer: Self-pay

## 2017-05-28 NOTE — Patient Outreach (Signed)
[facility]) Care Management  05/28/2017  Brett Scott [birth year] [id number]   Patient has not responded to calls or letter. Will proceed with case closure.  Will notify care management assistant of case status.    Jone Baseman, RN, MSN [place] [phone]

## 2019-07-27 DIAGNOSIS — M069 Rheumatoid arthritis, unspecified: Secondary | ICD-10-CM | POA: Diagnosis not present

## 2019-07-27 DIAGNOSIS — I10 Essential (primary) hypertension: Secondary | ICD-10-CM | POA: Diagnosis not present

## 2019-07-27 DIAGNOSIS — Z79899 Other long term (current) drug therapy: Secondary | ICD-10-CM | POA: Diagnosis not present

## 2019-07-27 DIAGNOSIS — E782 Mixed hyperlipidemia: Secondary | ICD-10-CM | POA: Diagnosis not present

## 2019-07-27 DIAGNOSIS — N401 Enlarged prostate with lower urinary tract symptoms: Secondary | ICD-10-CM | POA: Diagnosis not present

## 2019-07-27 DIAGNOSIS — F322 Major depressive disorder, single episode, severe without psychotic features: Secondary | ICD-10-CM | POA: Diagnosis not present

## 2019-07-27 DIAGNOSIS — M1990 Unspecified osteoarthritis, unspecified site: Secondary | ICD-10-CM | POA: Diagnosis not present

## 2019-07-27 DIAGNOSIS — J449 Chronic obstructive pulmonary disease, unspecified: Secondary | ICD-10-CM | POA: Diagnosis not present

## 2019-07-27 DIAGNOSIS — I672 Cerebral atherosclerosis: Secondary | ICD-10-CM | POA: Diagnosis not present

## 2019-08-11 DIAGNOSIS — G4730 Sleep apnea, unspecified: Secondary | ICD-10-CM | POA: Diagnosis not present

## 2019-08-11 DIAGNOSIS — J449 Chronic obstructive pulmonary disease, unspecified: Secondary | ICD-10-CM | POA: Diagnosis not present

## 2019-08-11 DIAGNOSIS — R0902 Hypoxemia: Secondary | ICD-10-CM | POA: Diagnosis not present

## 2019-08-11 DIAGNOSIS — I10 Essential (primary) hypertension: Secondary | ICD-10-CM | POA: Diagnosis not present

## 2019-08-18 DIAGNOSIS — R0609 Other forms of dyspnea: Secondary | ICD-10-CM | POA: Diagnosis not present

## 2019-08-18 DIAGNOSIS — J8410 Pulmonary fibrosis, unspecified: Secondary | ICD-10-CM | POA: Diagnosis not present

## 2019-08-18 DIAGNOSIS — R0902 Hypoxemia: Secondary | ICD-10-CM | POA: Diagnosis not present

## 2019-08-18 DIAGNOSIS — J849 Interstitial pulmonary disease, unspecified: Secondary | ICD-10-CM | POA: Diagnosis not present

## 2019-08-18 DIAGNOSIS — J449 Chronic obstructive pulmonary disease, unspecified: Secondary | ICD-10-CM | POA: Diagnosis not present

## 2019-08-18 DIAGNOSIS — G4730 Sleep apnea, unspecified: Secondary | ICD-10-CM | POA: Diagnosis not present

## 2019-10-04 DIAGNOSIS — J449 Chronic obstructive pulmonary disease, unspecified: Secondary | ICD-10-CM | POA: Diagnosis not present

## 2019-10-04 DIAGNOSIS — M1990 Unspecified osteoarthritis, unspecified site: Secondary | ICD-10-CM | POA: Diagnosis not present

## 2019-10-04 DIAGNOSIS — R0982 Postnasal drip: Secondary | ICD-10-CM | POA: Diagnosis not present

## 2019-12-03 DIAGNOSIS — R270 Ataxia, unspecified: Secondary | ICD-10-CM | POA: Diagnosis not present

## 2019-12-03 DIAGNOSIS — R0902 Hypoxemia: Secondary | ICD-10-CM | POA: Diagnosis not present

## 2019-12-03 DIAGNOSIS — R1310 Dysphagia, unspecified: Secondary | ICD-10-CM | POA: Diagnosis not present

## 2019-12-03 DIAGNOSIS — I5043 Acute on chronic combined systolic (congestive) and diastolic (congestive) heart failure: Secondary | ICD-10-CM | POA: Diagnosis not present

## 2019-12-03 DIAGNOSIS — I248 Other forms of acute ischemic heart disease: Secondary | ICD-10-CM | POA: Diagnosis not present

## 2019-12-03 DIAGNOSIS — Z20822 Contact with and (suspected) exposure to covid-19: Secondary | ICD-10-CM | POA: Diagnosis not present

## 2019-12-03 DIAGNOSIS — I272 Pulmonary hypertension, unspecified: Secondary | ICD-10-CM | POA: Diagnosis not present

## 2019-12-03 DIAGNOSIS — R0602 Shortness of breath: Secondary | ICD-10-CM | POA: Diagnosis not present

## 2019-12-03 DIAGNOSIS — R001 Bradycardia, unspecified: Secondary | ICD-10-CM | POA: Diagnosis not present

## 2019-12-03 DIAGNOSIS — I959 Hypotension, unspecified: Secondary | ICD-10-CM | POA: Diagnosis not present

## 2019-12-03 DIAGNOSIS — N183 Chronic kidney disease, stage 3 unspecified: Secondary | ICD-10-CM | POA: Diagnosis not present

## 2019-12-03 DIAGNOSIS — Z8701 Personal history of pneumonia (recurrent): Secondary | ICD-10-CM | POA: Diagnosis not present

## 2019-12-03 DIAGNOSIS — R42 Dizziness and giddiness: Secondary | ICD-10-CM | POA: Diagnosis not present

## 2019-12-03 DIAGNOSIS — R41841 Cognitive communication deficit: Secondary | ICD-10-CM | POA: Diagnosis not present

## 2019-12-03 DIAGNOSIS — R519 Headache, unspecified: Secondary | ICD-10-CM | POA: Diagnosis not present

## 2019-12-03 DIAGNOSIS — Z7409 Other reduced mobility: Secondary | ICD-10-CM | POA: Diagnosis not present

## 2019-12-03 DIAGNOSIS — J438 Other emphysema: Secondary | ICD-10-CM | POA: Diagnosis not present

## 2019-12-03 DIAGNOSIS — R778 Other specified abnormalities of plasma proteins: Secondary | ICD-10-CM | POA: Diagnosis not present

## 2019-12-03 DIAGNOSIS — J8410 Pulmonary fibrosis, unspecified: Secondary | ICD-10-CM | POA: Diagnosis not present

## 2019-12-03 DIAGNOSIS — M0600 Rheumatoid arthritis without rheumatoid factor, unspecified site: Secondary | ICD-10-CM | POA: Diagnosis not present

## 2019-12-03 DIAGNOSIS — R627 Adult failure to thrive: Secondary | ICD-10-CM | POA: Diagnosis not present

## 2019-12-03 DIAGNOSIS — I679 Cerebrovascular disease, unspecified: Secondary | ICD-10-CM | POA: Diagnosis not present

## 2019-12-03 DIAGNOSIS — J9620 Acute and chronic respiratory failure, unspecified whether with hypoxia or hypercapnia: Secondary | ICD-10-CM | POA: Diagnosis not present

## 2019-12-03 DIAGNOSIS — N189 Chronic kidney disease, unspecified: Secondary | ICD-10-CM | POA: Diagnosis not present

## 2019-12-03 DIAGNOSIS — J9610 Chronic respiratory failure, unspecified whether with hypoxia or hypercapnia: Secondary | ICD-10-CM | POA: Diagnosis not present

## 2019-12-03 DIAGNOSIS — J439 Emphysema, unspecified: Secondary | ICD-10-CM | POA: Diagnosis not present

## 2019-12-03 DIAGNOSIS — D519 Vitamin B12 deficiency anemia, unspecified: Secondary | ICD-10-CM | POA: Diagnosis not present

## 2019-12-03 DIAGNOSIS — C49A0 Gastrointestinal stromal tumor, unspecified site: Secondary | ICD-10-CM | POA: Diagnosis not present

## 2019-12-03 DIAGNOSIS — Z7401 Bed confinement status: Secondary | ICD-10-CM | POA: Diagnosis not present

## 2019-12-03 DIAGNOSIS — R5381 Other malaise: Secondary | ICD-10-CM | POA: Diagnosis not present

## 2019-12-03 DIAGNOSIS — I5020 Unspecified systolic (congestive) heart failure: Secondary | ICD-10-CM | POA: Diagnosis not present

## 2019-12-03 DIAGNOSIS — R531 Weakness: Secondary | ICD-10-CM | POA: Diagnosis not present

## 2019-12-03 DIAGNOSIS — G9608 Other cranial cerebrospinal fluid leak: Secondary | ICD-10-CM | POA: Diagnosis not present

## 2019-12-03 DIAGNOSIS — M2550 Pain in unspecified joint: Secondary | ICD-10-CM | POA: Diagnosis not present

## 2019-12-03 DIAGNOSIS — J9621 Acute and chronic respiratory failure with hypoxia: Secondary | ICD-10-CM | POA: Diagnosis not present

## 2019-12-03 DIAGNOSIS — R000 Tachycardia, unspecified: Secondary | ICD-10-CM | POA: Diagnosis not present

## 2019-12-03 DIAGNOSIS — Z87891 Personal history of nicotine dependence: Secondary | ICD-10-CM | POA: Diagnosis not present

## 2019-12-03 DIAGNOSIS — R296 Repeated falls: Secondary | ICD-10-CM | POA: Diagnosis not present

## 2019-12-03 DIAGNOSIS — J189 Pneumonia, unspecified organism: Secondary | ICD-10-CM | POA: Diagnosis not present

## 2019-12-03 DIAGNOSIS — C494 Malignant neoplasm of connective and soft tissue of abdomen: Secondary | ICD-10-CM | POA: Diagnosis not present

## 2019-12-03 DIAGNOSIS — I130 Hypertensive heart and chronic kidney disease with heart failure and stage 1 through stage 4 chronic kidney disease, or unspecified chronic kidney disease: Secondary | ICD-10-CM | POA: Diagnosis not present

## 2019-12-03 DIAGNOSIS — C49A9 Gastrointestinal stromal tumor of other sites: Secondary | ICD-10-CM | POA: Diagnosis not present

## 2019-12-03 DIAGNOSIS — J449 Chronic obstructive pulmonary disease, unspecified: Secondary | ICD-10-CM | POA: Diagnosis not present

## 2019-12-03 DIAGNOSIS — N1831 Chronic kidney disease, stage 3a: Secondary | ICD-10-CM | POA: Diagnosis not present

## 2019-12-09 DIAGNOSIS — C49A9 Gastrointestinal stromal tumor of other sites: Secondary | ICD-10-CM | POA: Diagnosis not present

## 2019-12-09 DIAGNOSIS — I272 Pulmonary hypertension, unspecified: Secondary | ICD-10-CM | POA: Diagnosis not present

## 2019-12-09 DIAGNOSIS — M2550 Pain in unspecified joint: Secondary | ICD-10-CM | POA: Diagnosis not present

## 2019-12-09 DIAGNOSIS — Z87891 Personal history of nicotine dependence: Secondary | ICD-10-CM | POA: Diagnosis not present

## 2019-12-09 DIAGNOSIS — J438 Other emphysema: Secondary | ICD-10-CM | POA: Diagnosis not present

## 2019-12-09 DIAGNOSIS — J449 Chronic obstructive pulmonary disease, unspecified: Secondary | ICD-10-CM | POA: Diagnosis not present

## 2019-12-09 DIAGNOSIS — I509 Heart failure, unspecified: Secondary | ICD-10-CM | POA: Diagnosis not present

## 2019-12-09 DIAGNOSIS — R627 Adult failure to thrive: Secondary | ICD-10-CM | POA: Diagnosis not present

## 2019-12-09 DIAGNOSIS — I639 Cerebral infarction, unspecified: Secondary | ICD-10-CM | POA: Diagnosis not present

## 2019-12-09 DIAGNOSIS — M0600 Rheumatoid arthritis without rheumatoid factor, unspecified site: Secondary | ICD-10-CM | POA: Diagnosis not present

## 2019-12-09 DIAGNOSIS — Z8701 Personal history of pneumonia (recurrent): Secondary | ICD-10-CM | POA: Diagnosis not present

## 2019-12-09 DIAGNOSIS — I5043 Acute on chronic combined systolic (congestive) and diastolic (congestive) heart failure: Secondary | ICD-10-CM | POA: Diagnosis not present

## 2019-12-09 DIAGNOSIS — I959 Hypotension, unspecified: Secondary | ICD-10-CM | POA: Diagnosis not present

## 2019-12-09 DIAGNOSIS — Z7401 Bed confinement status: Secondary | ICD-10-CM | POA: Diagnosis not present

## 2019-12-09 DIAGNOSIS — J9621 Acute and chronic respiratory failure with hypoxia: Secondary | ICD-10-CM | POA: Diagnosis not present

## 2019-12-09 DIAGNOSIS — I5020 Unspecified systolic (congestive) heart failure: Secondary | ICD-10-CM | POA: Diagnosis not present

## 2019-12-09 DIAGNOSIS — J439 Emphysema, unspecified: Secondary | ICD-10-CM | POA: Diagnosis not present

## 2019-12-09 DIAGNOSIS — N183 Chronic kidney disease, stage 3 unspecified: Secondary | ICD-10-CM | POA: Diagnosis not present

## 2019-12-09 DIAGNOSIS — R0602 Shortness of breath: Secondary | ICD-10-CM | POA: Diagnosis not present

## 2019-12-09 DIAGNOSIS — J9610 Chronic respiratory failure, unspecified whether with hypoxia or hypercapnia: Secondary | ICD-10-CM | POA: Diagnosis not present

## 2019-12-09 DIAGNOSIS — J8410 Pulmonary fibrosis, unspecified: Secondary | ICD-10-CM | POA: Diagnosis not present

## 2019-12-09 DIAGNOSIS — D519 Vitamin B12 deficiency anemia, unspecified: Secondary | ICD-10-CM | POA: Diagnosis not present

## 2019-12-09 DIAGNOSIS — N189 Chronic kidney disease, unspecified: Secondary | ICD-10-CM | POA: Diagnosis not present

## 2019-12-09 DIAGNOSIS — J189 Pneumonia, unspecified organism: Secondary | ICD-10-CM | POA: Diagnosis not present

## 2019-12-13 DIAGNOSIS — J189 Pneumonia, unspecified organism: Secondary | ICD-10-CM | POA: Diagnosis not present

## 2019-12-13 DIAGNOSIS — I509 Heart failure, unspecified: Secondary | ICD-10-CM | POA: Diagnosis not present

## 2019-12-13 DIAGNOSIS — I639 Cerebral infarction, unspecified: Secondary | ICD-10-CM | POA: Diagnosis not present

## 2019-12-13 DIAGNOSIS — N189 Chronic kidney disease, unspecified: Secondary | ICD-10-CM | POA: Diagnosis not present

## 2019-12-16 DIAGNOSIS — J439 Emphysema, unspecified: Secondary | ICD-10-CM | POA: Diagnosis not present

## 2019-12-16 DIAGNOSIS — J189 Pneumonia, unspecified organism: Secondary | ICD-10-CM | POA: Diagnosis not present

## 2019-12-16 DIAGNOSIS — I509 Heart failure, unspecified: Secondary | ICD-10-CM | POA: Diagnosis not present

## 2019-12-23 DIAGNOSIS — I639 Cerebral infarction, unspecified: Secondary | ICD-10-CM | POA: Diagnosis not present

## 2019-12-23 DIAGNOSIS — J189 Pneumonia, unspecified organism: Secondary | ICD-10-CM | POA: Diagnosis not present

## 2019-12-23 DIAGNOSIS — N189 Chronic kidney disease, unspecified: Secondary | ICD-10-CM | POA: Diagnosis not present

## 2019-12-23 DIAGNOSIS — I509 Heart failure, unspecified: Secondary | ICD-10-CM | POA: Diagnosis not present

## 2019-12-26 DIAGNOSIS — I5042 Chronic combined systolic (congestive) and diastolic (congestive) heart failure: Secondary | ICD-10-CM | POA: Diagnosis not present

## 2019-12-26 DIAGNOSIS — I5023 Acute on chronic systolic (congestive) heart failure: Secondary | ICD-10-CM | POA: Diagnosis not present

## 2019-12-26 DIAGNOSIS — J8410 Pulmonary fibrosis, unspecified: Secondary | ICD-10-CM | POA: Diagnosis not present

## 2019-12-26 DIAGNOSIS — J439 Emphysema, unspecified: Secondary | ICD-10-CM | POA: Diagnosis not present

## 2019-12-26 DIAGNOSIS — Z9981 Dependence on supplemental oxygen: Secondary | ICD-10-CM | POA: Diagnosis not present

## 2019-12-26 DIAGNOSIS — I272 Pulmonary hypertension, unspecified: Secondary | ICD-10-CM | POA: Diagnosis not present

## 2019-12-26 DIAGNOSIS — J9600 Acute respiratory failure, unspecified whether with hypoxia or hypercapnia: Secondary | ICD-10-CM | POA: Diagnosis not present

## 2019-12-26 DIAGNOSIS — N189 Chronic kidney disease, unspecified: Secondary | ICD-10-CM | POA: Diagnosis not present

## 2019-12-26 DIAGNOSIS — I2582 Chronic total occlusion of coronary artery: Secondary | ICD-10-CM | POA: Diagnosis not present

## 2019-12-26 DIAGNOSIS — I2510 Atherosclerotic heart disease of native coronary artery without angina pectoris: Secondary | ICD-10-CM | POA: Diagnosis not present

## 2019-12-26 DIAGNOSIS — I2722 Pulmonary hypertension due to left heart disease: Secondary | ICD-10-CM | POA: Diagnosis not present

## 2019-12-26 DIAGNOSIS — Z20822 Contact with and (suspected) exposure to covid-19: Secondary | ICD-10-CM | POA: Diagnosis not present

## 2019-12-26 DIAGNOSIS — I498 Other specified cardiac arrhythmias: Secondary | ICD-10-CM | POA: Diagnosis not present

## 2019-12-26 DIAGNOSIS — I2781 Cor pulmonale (chronic): Secondary | ICD-10-CM | POA: Diagnosis not present

## 2019-12-26 DIAGNOSIS — I472 Ventricular tachycardia: Secondary | ICD-10-CM | POA: Diagnosis not present

## 2019-12-26 DIAGNOSIS — I5020 Unspecified systolic (congestive) heart failure: Secondary | ICD-10-CM | POA: Diagnosis not present

## 2019-12-26 DIAGNOSIS — Z7401 Bed confinement status: Secondary | ICD-10-CM | POA: Diagnosis not present

## 2019-12-26 DIAGNOSIS — I2723 Pulmonary hypertension due to lung diseases and hypoxia: Secondary | ICD-10-CM | POA: Diagnosis not present

## 2019-12-26 DIAGNOSIS — I50810 Right heart failure, unspecified: Secondary | ICD-10-CM | POA: Diagnosis not present

## 2019-12-26 DIAGNOSIS — R55 Syncope and collapse: Secondary | ICD-10-CM | POA: Diagnosis not present

## 2019-12-26 DIAGNOSIS — R0902 Hypoxemia: Secondary | ICD-10-CM | POA: Diagnosis not present

## 2019-12-26 DIAGNOSIS — N179 Acute kidney failure, unspecified: Secondary | ICD-10-CM | POA: Diagnosis not present

## 2019-12-26 DIAGNOSIS — E538 Deficiency of other specified B group vitamins: Secondary | ICD-10-CM | POA: Diagnosis not present

## 2019-12-26 DIAGNOSIS — Z87891 Personal history of nicotine dependence: Secondary | ICD-10-CM | POA: Diagnosis not present

## 2019-12-26 DIAGNOSIS — I679 Cerebrovascular disease, unspecified: Secondary | ICD-10-CM | POA: Diagnosis not present

## 2019-12-26 DIAGNOSIS — R9431 Abnormal electrocardiogram [ECG] [EKG]: Secondary | ICD-10-CM | POA: Diagnosis not present

## 2019-12-26 DIAGNOSIS — Z8673 Personal history of transient ischemic attack (TIA), and cerebral infarction without residual deficits: Secondary | ICD-10-CM | POA: Diagnosis not present

## 2019-12-26 DIAGNOSIS — N183 Chronic kidney disease, stage 3 unspecified: Secondary | ICD-10-CM | POA: Diagnosis not present

## 2019-12-26 DIAGNOSIS — I428 Other cardiomyopathies: Secondary | ICD-10-CM | POA: Diagnosis not present

## 2019-12-26 DIAGNOSIS — J9621 Acute and chronic respiratory failure with hypoxia: Secondary | ICD-10-CM | POA: Diagnosis not present

## 2019-12-26 DIAGNOSIS — R0602 Shortness of breath: Secondary | ICD-10-CM | POA: Diagnosis not present

## 2019-12-26 DIAGNOSIS — Z515 Encounter for palliative care: Secondary | ICD-10-CM | POA: Diagnosis not present

## 2019-12-26 DIAGNOSIS — D638 Anemia in other chronic diseases classified elsewhere: Secondary | ICD-10-CM | POA: Diagnosis not present

## 2019-12-26 DIAGNOSIS — M2550 Pain in unspecified joint: Secondary | ICD-10-CM | POA: Diagnosis not present

## 2019-12-26 DIAGNOSIS — I5022 Chronic systolic (congestive) heart failure: Secondary | ICD-10-CM | POA: Diagnosis not present

## 2019-12-26 DIAGNOSIS — W06XXXA Fall from bed, initial encounter: Secondary | ICD-10-CM | POA: Diagnosis not present

## 2019-12-26 DIAGNOSIS — J9611 Chronic respiratory failure with hypoxia: Secondary | ICD-10-CM | POA: Diagnosis not present

## 2019-12-26 DIAGNOSIS — R197 Diarrhea, unspecified: Secondary | ICD-10-CM | POA: Diagnosis not present

## 2019-12-26 DIAGNOSIS — M0600 Rheumatoid arthritis without rheumatoid factor, unspecified site: Secondary | ICD-10-CM | POA: Diagnosis not present

## 2019-12-26 DIAGNOSIS — I959 Hypotension, unspecified: Secondary | ICD-10-CM | POA: Diagnosis not present

## 2019-12-26 DIAGNOSIS — I130 Hypertensive heart and chronic kidney disease with heart failure and stage 1 through stage 4 chronic kidney disease, or unspecified chronic kidney disease: Secondary | ICD-10-CM | POA: Diagnosis not present

## 2019-12-26 DIAGNOSIS — J9601 Acute respiratory failure with hypoxia: Secondary | ICD-10-CM | POA: Diagnosis not present

## 2019-12-26 DIAGNOSIS — I252 Old myocardial infarction: Secondary | ICD-10-CM | POA: Diagnosis not present

## 2019-12-26 DIAGNOSIS — I501 Left ventricular failure: Secondary | ICD-10-CM | POA: Diagnosis not present

## 2019-12-26 DIAGNOSIS — R918 Other nonspecific abnormal finding of lung field: Secondary | ICD-10-CM | POA: Diagnosis not present

## 2019-12-26 DIAGNOSIS — I214 Non-ST elevation (NSTEMI) myocardial infarction: Secondary | ICD-10-CM | POA: Diagnosis not present

## 2019-12-26 DIAGNOSIS — C494 Malignant neoplasm of connective and soft tissue of abdomen: Secondary | ICD-10-CM | POA: Diagnosis not present

## 2019-12-26 DIAGNOSIS — N1831 Chronic kidney disease, stage 3a: Secondary | ICD-10-CM | POA: Diagnosis not present

## 2019-12-26 DIAGNOSIS — J449 Chronic obstructive pulmonary disease, unspecified: Secondary | ICD-10-CM | POA: Diagnosis not present

## 2019-12-26 DIAGNOSIS — R778 Other specified abnormalities of plasma proteins: Secondary | ICD-10-CM | POA: Diagnosis not present

## 2019-12-26 DIAGNOSIS — I5082 Biventricular heart failure: Secondary | ICD-10-CM | POA: Diagnosis not present

## 2019-12-26 DIAGNOSIS — W19XXXA Unspecified fall, initial encounter: Secondary | ICD-10-CM | POA: Diagnosis not present

## 2020-01-05 DIAGNOSIS — M545 Low back pain: Secondary | ICD-10-CM | POA: Diagnosis not present

## 2020-01-05 DIAGNOSIS — N390 Urinary tract infection, site not specified: Secondary | ICD-10-CM | POA: Diagnosis not present

## 2020-01-05 DIAGNOSIS — R296 Repeated falls: Secondary | ICD-10-CM | POA: Diagnosis not present

## 2020-01-05 DIAGNOSIS — I272 Pulmonary hypertension, unspecified: Secondary | ICD-10-CM | POA: Diagnosis not present

## 2020-01-05 DIAGNOSIS — I517 Cardiomegaly: Secondary | ICD-10-CM | POA: Diagnosis not present

## 2020-01-05 DIAGNOSIS — S0990XA Unspecified injury of head, initial encounter: Secondary | ICD-10-CM | POA: Diagnosis not present

## 2020-01-05 DIAGNOSIS — I21A1 Myocardial infarction type 2: Secondary | ICD-10-CM | POA: Diagnosis not present

## 2020-01-05 DIAGNOSIS — N183 Chronic kidney disease, stage 3 unspecified: Secondary | ICD-10-CM | POA: Diagnosis not present

## 2020-01-05 DIAGNOSIS — I5042 Chronic combined systolic (congestive) and diastolic (congestive) heart failure: Secondary | ICD-10-CM | POA: Diagnosis not present

## 2020-01-05 DIAGNOSIS — Z20822 Contact with and (suspected) exposure to covid-19: Secondary | ICD-10-CM | POA: Diagnosis not present

## 2020-01-05 DIAGNOSIS — E785 Hyperlipidemia, unspecified: Secondary | ICD-10-CM | POA: Diagnosis not present

## 2020-01-05 DIAGNOSIS — R7989 Other specified abnormal findings of blood chemistry: Secondary | ICD-10-CM | POA: Diagnosis not present

## 2020-01-05 DIAGNOSIS — W19XXXA Unspecified fall, initial encounter: Secondary | ICD-10-CM | POA: Diagnosis not present

## 2020-01-05 DIAGNOSIS — E871 Hypo-osmolality and hyponatremia: Secondary | ICD-10-CM | POA: Diagnosis not present

## 2020-01-05 DIAGNOSIS — I428 Other cardiomyopathies: Secondary | ICD-10-CM | POA: Diagnosis not present

## 2020-01-05 DIAGNOSIS — I959 Hypotension, unspecified: Secondary | ICD-10-CM | POA: Diagnosis not present

## 2020-01-05 DIAGNOSIS — R69 Illness, unspecified: Secondary | ICD-10-CM | POA: Diagnosis not present

## 2020-01-05 DIAGNOSIS — D649 Anemia, unspecified: Secondary | ICD-10-CM | POA: Diagnosis not present

## 2020-01-05 DIAGNOSIS — C494 Malignant neoplasm of connective and soft tissue of abdomen: Secondary | ICD-10-CM | POA: Diagnosis not present

## 2020-01-05 DIAGNOSIS — J9601 Acute respiratory failure with hypoxia: Secondary | ICD-10-CM | POA: Diagnosis not present

## 2020-01-05 DIAGNOSIS — Z87891 Personal history of nicotine dependence: Secondary | ICD-10-CM | POA: Diagnosis not present

## 2020-01-05 DIAGNOSIS — E8881 Metabolic syndrome: Secondary | ICD-10-CM | POA: Diagnosis not present

## 2020-01-05 DIAGNOSIS — A4151 Sepsis due to Escherichia coli [E. coli]: Secondary | ICD-10-CM | POA: Diagnosis not present

## 2020-01-05 DIAGNOSIS — I214 Non-ST elevation (NSTEMI) myocardial infarction: Secondary | ICD-10-CM | POA: Diagnosis not present

## 2020-01-05 DIAGNOSIS — M2550 Pain in unspecified joint: Secondary | ICD-10-CM | POA: Diagnosis not present

## 2020-01-05 DIAGNOSIS — R6520 Severe sepsis without septic shock: Secondary | ICD-10-CM | POA: Diagnosis not present

## 2020-01-05 DIAGNOSIS — J449 Chronic obstructive pulmonary disease, unspecified: Secondary | ICD-10-CM | POA: Diagnosis not present

## 2020-01-05 DIAGNOSIS — J9611 Chronic respiratory failure with hypoxia: Secondary | ICD-10-CM | POA: Diagnosis not present

## 2020-01-05 DIAGNOSIS — Z7401 Bed confinement status: Secondary | ICD-10-CM | POA: Diagnosis not present

## 2020-01-05 DIAGNOSIS — I5022 Chronic systolic (congestive) heart failure: Secondary | ICD-10-CM | POA: Diagnosis not present

## 2020-01-05 DIAGNOSIS — I420 Dilated cardiomyopathy: Secondary | ICD-10-CM | POA: Diagnosis not present

## 2020-01-05 DIAGNOSIS — N189 Chronic kidney disease, unspecified: Secondary | ICD-10-CM | POA: Diagnosis not present

## 2020-01-05 DIAGNOSIS — A419 Sepsis, unspecified organism: Secondary | ICD-10-CM | POA: Diagnosis not present

## 2020-01-05 DIAGNOSIS — Y998 Other external cause status: Secondary | ICD-10-CM | POA: Diagnosis not present

## 2020-01-05 DIAGNOSIS — Z1623 Resistance to quinolones and fluoroquinolones: Secondary | ICD-10-CM | POA: Diagnosis not present

## 2020-01-05 DIAGNOSIS — J9600 Acute respiratory failure, unspecified whether with hypoxia or hypercapnia: Secondary | ICD-10-CM | POA: Diagnosis not present

## 2020-01-05 DIAGNOSIS — J9610 Chronic respiratory failure, unspecified whether with hypoxia or hypercapnia: Secondary | ICD-10-CM | POA: Diagnosis not present

## 2020-01-05 DIAGNOSIS — W228XXA Striking against or struck by other objects, initial encounter: Secondary | ICD-10-CM | POA: Diagnosis not present

## 2020-01-05 DIAGNOSIS — Z8673 Personal history of transient ischemic attack (TIA), and cerebral infarction without residual deficits: Secondary | ICD-10-CM | POA: Diagnosis not present

## 2020-01-05 DIAGNOSIS — I371 Nonrheumatic pulmonary valve insufficiency: Secondary | ICD-10-CM | POA: Diagnosis not present

## 2020-01-05 DIAGNOSIS — I361 Nonrheumatic tricuspid (valve) insufficiency: Secondary | ICD-10-CM | POA: Diagnosis not present

## 2020-01-05 DIAGNOSIS — N179 Acute kidney failure, unspecified: Secondary | ICD-10-CM | POA: Diagnosis not present

## 2020-01-05 DIAGNOSIS — I6523 Occlusion and stenosis of bilateral carotid arteries: Secondary | ICD-10-CM | POA: Diagnosis not present

## 2020-01-05 DIAGNOSIS — R55 Syncope and collapse: Secondary | ICD-10-CM | POA: Diagnosis not present

## 2020-01-05 DIAGNOSIS — Z515 Encounter for palliative care: Secondary | ICD-10-CM | POA: Diagnosis not present

## 2020-01-05 DIAGNOSIS — I5020 Unspecified systolic (congestive) heart failure: Secondary | ICD-10-CM | POA: Diagnosis not present

## 2020-01-05 DIAGNOSIS — I2510 Atherosclerotic heart disease of native coronary artery without angina pectoris: Secondary | ICD-10-CM | POA: Diagnosis not present

## 2020-01-05 DIAGNOSIS — D72829 Elevated white blood cell count, unspecified: Secondary | ICD-10-CM | POA: Diagnosis not present

## 2020-01-05 DIAGNOSIS — Z66 Do not resuscitate: Secondary | ICD-10-CM | POA: Diagnosis not present

## 2020-01-05 DIAGNOSIS — R0902 Hypoxemia: Secondary | ICD-10-CM | POA: Diagnosis not present

## 2020-01-05 DIAGNOSIS — I679 Cerebrovascular disease, unspecified: Secondary | ICD-10-CM | POA: Diagnosis not present

## 2020-01-05 DIAGNOSIS — G309 Alzheimer's disease, unspecified: Secondary | ICD-10-CM | POA: Diagnosis not present

## 2020-01-05 DIAGNOSIS — K219 Gastro-esophageal reflux disease without esophagitis: Secondary | ICD-10-CM | POA: Diagnosis not present

## 2020-01-05 DIAGNOSIS — J9621 Acute and chronic respiratory failure with hypoxia: Secondary | ICD-10-CM | POA: Diagnosis not present

## 2020-01-05 DIAGNOSIS — J8410 Pulmonary fibrosis, unspecified: Secondary | ICD-10-CM | POA: Diagnosis not present

## 2020-01-06 DIAGNOSIS — J9610 Chronic respiratory failure, unspecified whether with hypoxia or hypercapnia: Secondary | ICD-10-CM | POA: Diagnosis not present

## 2020-01-06 DIAGNOSIS — E785 Hyperlipidemia, unspecified: Secondary | ICD-10-CM | POA: Diagnosis not present

## 2020-01-06 DIAGNOSIS — J449 Chronic obstructive pulmonary disease, unspecified: Secondary | ICD-10-CM | POA: Diagnosis not present

## 2020-01-06 DIAGNOSIS — K219 Gastro-esophageal reflux disease without esophagitis: Secondary | ICD-10-CM | POA: Diagnosis not present

## 2020-01-07 DIAGNOSIS — E871 Hypo-osmolality and hyponatremia: Secondary | ICD-10-CM | POA: Diagnosis not present

## 2020-01-07 DIAGNOSIS — J8410 Pulmonary fibrosis, unspecified: Secondary | ICD-10-CM | POA: Diagnosis not present

## 2020-01-07 DIAGNOSIS — M2550 Pain in unspecified joint: Secondary | ICD-10-CM | POA: Diagnosis not present

## 2020-01-07 DIAGNOSIS — R296 Repeated falls: Secondary | ICD-10-CM | POA: Diagnosis not present

## 2020-01-07 DIAGNOSIS — D72829 Elevated white blood cell count, unspecified: Secondary | ICD-10-CM | POA: Diagnosis not present

## 2020-01-07 DIAGNOSIS — J9611 Chronic respiratory failure with hypoxia: Secondary | ICD-10-CM | POA: Diagnosis not present

## 2020-01-07 DIAGNOSIS — M25511 Pain in right shoulder: Secondary | ICD-10-CM | POA: Diagnosis not present

## 2020-01-07 DIAGNOSIS — Z20822 Contact with and (suspected) exposure to covid-19: Secondary | ICD-10-CM | POA: Diagnosis not present

## 2020-01-07 DIAGNOSIS — A4151 Sepsis due to Escherichia coli [E. coli]: Secondary | ICD-10-CM | POA: Diagnosis not present

## 2020-01-07 DIAGNOSIS — R6520 Severe sepsis without septic shock: Secondary | ICD-10-CM | POA: Diagnosis not present

## 2020-01-07 DIAGNOSIS — I5020 Unspecified systolic (congestive) heart failure: Secondary | ICD-10-CM | POA: Diagnosis not present

## 2020-01-07 DIAGNOSIS — R69 Illness, unspecified: Secondary | ICD-10-CM | POA: Diagnosis not present

## 2020-01-07 DIAGNOSIS — Y998 Other external cause status: Secondary | ICD-10-CM | POA: Diagnosis not present

## 2020-01-07 DIAGNOSIS — N189 Chronic kidney disease, unspecified: Secondary | ICD-10-CM | POA: Diagnosis not present

## 2020-01-07 DIAGNOSIS — K219 Gastro-esophageal reflux disease without esophagitis: Secondary | ICD-10-CM | POA: Diagnosis not present

## 2020-01-07 DIAGNOSIS — I5042 Chronic combined systolic (congestive) and diastolic (congestive) heart failure: Secondary | ICD-10-CM | POA: Diagnosis not present

## 2020-01-07 DIAGNOSIS — G309 Alzheimer's disease, unspecified: Secondary | ICD-10-CM | POA: Diagnosis not present

## 2020-01-07 DIAGNOSIS — J9601 Acute respiratory failure with hypoxia: Secondary | ICD-10-CM | POA: Diagnosis not present

## 2020-01-07 DIAGNOSIS — Z66 Do not resuscitate: Secondary | ICD-10-CM | POA: Diagnosis not present

## 2020-01-07 DIAGNOSIS — Z9981 Dependence on supplemental oxygen: Secondary | ICD-10-CM | POA: Diagnosis not present

## 2020-01-07 DIAGNOSIS — M545 Low back pain: Secondary | ICD-10-CM | POA: Diagnosis not present

## 2020-01-07 DIAGNOSIS — I5022 Chronic systolic (congestive) heart failure: Secondary | ICD-10-CM | POA: Diagnosis not present

## 2020-01-07 DIAGNOSIS — I2510 Atherosclerotic heart disease of native coronary artery without angina pectoris: Secondary | ICD-10-CM | POA: Diagnosis not present

## 2020-01-07 DIAGNOSIS — I252 Old myocardial infarction: Secondary | ICD-10-CM | POA: Diagnosis not present

## 2020-01-07 DIAGNOSIS — Z8673 Personal history of transient ischemic attack (TIA), and cerebral infarction without residual deficits: Secondary | ICD-10-CM | POA: Diagnosis not present

## 2020-01-07 DIAGNOSIS — R531 Weakness: Secondary | ICD-10-CM | POA: Diagnosis not present

## 2020-01-07 DIAGNOSIS — I371 Nonrheumatic pulmonary valve insufficiency: Secondary | ICD-10-CM | POA: Diagnosis not present

## 2020-01-07 DIAGNOSIS — I420 Dilated cardiomyopathy: Secondary | ICD-10-CM | POA: Diagnosis not present

## 2020-01-07 DIAGNOSIS — S0990XA Unspecified injury of head, initial encounter: Secondary | ICD-10-CM | POA: Diagnosis not present

## 2020-01-07 DIAGNOSIS — Z79899 Other long term (current) drug therapy: Secondary | ICD-10-CM | POA: Diagnosis not present

## 2020-01-07 DIAGNOSIS — Z1623 Resistance to quinolones and fluoroquinolones: Secondary | ICD-10-CM | POA: Diagnosis not present

## 2020-01-07 DIAGNOSIS — I517 Cardiomegaly: Secondary | ICD-10-CM | POA: Diagnosis not present

## 2020-01-07 DIAGNOSIS — A419 Sepsis, unspecified organism: Secondary | ICD-10-CM | POA: Diagnosis not present

## 2020-01-07 DIAGNOSIS — Z515 Encounter for palliative care: Secondary | ICD-10-CM | POA: Diagnosis not present

## 2020-01-07 DIAGNOSIS — I951 Orthostatic hypotension: Secondary | ICD-10-CM | POA: Diagnosis not present

## 2020-01-07 DIAGNOSIS — R55 Syncope and collapse: Secondary | ICD-10-CM | POA: Diagnosis not present

## 2020-01-07 DIAGNOSIS — I361 Nonrheumatic tricuspid (valve) insufficiency: Secondary | ICD-10-CM | POA: Diagnosis not present

## 2020-01-07 DIAGNOSIS — I214 Non-ST elevation (NSTEMI) myocardial infarction: Secondary | ICD-10-CM | POA: Diagnosis not present

## 2020-01-07 DIAGNOSIS — R7989 Other specified abnormal findings of blood chemistry: Secondary | ICD-10-CM | POA: Diagnosis not present

## 2020-01-07 DIAGNOSIS — N400 Enlarged prostate without lower urinary tract symptoms: Secondary | ICD-10-CM | POA: Diagnosis not present

## 2020-01-07 DIAGNOSIS — I428 Other cardiomyopathies: Secondary | ICD-10-CM | POA: Diagnosis not present

## 2020-01-07 DIAGNOSIS — E8881 Metabolic syndrome: Secondary | ICD-10-CM | POA: Diagnosis not present

## 2020-01-07 DIAGNOSIS — N183 Chronic kidney disease, stage 3 unspecified: Secondary | ICD-10-CM | POA: Diagnosis not present

## 2020-01-07 DIAGNOSIS — I959 Hypotension, unspecified: Secondary | ICD-10-CM | POA: Diagnosis not present

## 2020-01-07 DIAGNOSIS — Z7401 Bed confinement status: Secondary | ICD-10-CM | POA: Diagnosis not present

## 2020-01-07 DIAGNOSIS — I6523 Occlusion and stenosis of bilateral carotid arteries: Secondary | ICD-10-CM | POA: Diagnosis not present

## 2020-01-07 DIAGNOSIS — J449 Chronic obstructive pulmonary disease, unspecified: Secondary | ICD-10-CM | POA: Diagnosis not present

## 2020-01-07 DIAGNOSIS — I21A1 Myocardial infarction type 2: Secondary | ICD-10-CM | POA: Diagnosis not present

## 2020-01-07 DIAGNOSIS — W19XXXA Unspecified fall, initial encounter: Secondary | ICD-10-CM | POA: Diagnosis not present

## 2020-01-07 DIAGNOSIS — D649 Anemia, unspecified: Secondary | ICD-10-CM | POA: Diagnosis not present

## 2020-01-07 DIAGNOSIS — I272 Pulmonary hypertension, unspecified: Secondary | ICD-10-CM | POA: Diagnosis not present

## 2020-01-07 DIAGNOSIS — Z87891 Personal history of nicotine dependence: Secondary | ICD-10-CM | POA: Diagnosis not present

## 2020-01-07 DIAGNOSIS — C494 Malignant neoplasm of connective and soft tissue of abdomen: Secondary | ICD-10-CM | POA: Diagnosis not present

## 2020-01-07 DIAGNOSIS — I679 Cerebrovascular disease, unspecified: Secondary | ICD-10-CM | POA: Diagnosis not present

## 2020-01-07 DIAGNOSIS — N179 Acute kidney failure, unspecified: Secondary | ICD-10-CM | POA: Diagnosis not present

## 2020-01-07 DIAGNOSIS — N390 Urinary tract infection, site not specified: Secondary | ICD-10-CM | POA: Diagnosis not present

## 2020-01-07 DIAGNOSIS — W228XXA Striking against or struck by other objects, initial encounter: Secondary | ICD-10-CM | POA: Diagnosis not present

## 2020-01-10 DIAGNOSIS — J449 Chronic obstructive pulmonary disease, unspecified: Secondary | ICD-10-CM | POA: Diagnosis not present

## 2020-01-10 DIAGNOSIS — I5020 Unspecified systolic (congestive) heart failure: Secondary | ICD-10-CM | POA: Diagnosis not present

## 2020-01-10 DIAGNOSIS — N179 Acute kidney failure, unspecified: Secondary | ICD-10-CM | POA: Diagnosis not present

## 2020-01-10 DIAGNOSIS — Z515 Encounter for palliative care: Secondary | ICD-10-CM | POA: Diagnosis not present

## 2020-01-10 DIAGNOSIS — I252 Old myocardial infarction: Secondary | ICD-10-CM | POA: Diagnosis not present

## 2020-01-10 DIAGNOSIS — W19XXXA Unspecified fall, initial encounter: Secondary | ICD-10-CM | POA: Diagnosis not present

## 2020-01-10 DIAGNOSIS — N183 Chronic kidney disease, stage 3 unspecified: Secondary | ICD-10-CM | POA: Diagnosis not present

## 2020-01-10 DIAGNOSIS — I214 Non-ST elevation (NSTEMI) myocardial infarction: Secondary | ICD-10-CM | POA: Diagnosis not present

## 2020-01-12 DIAGNOSIS — D649 Anemia, unspecified: Secondary | ICD-10-CM | POA: Diagnosis not present

## 2020-01-12 DIAGNOSIS — N179 Acute kidney failure, unspecified: Secondary | ICD-10-CM | POA: Diagnosis not present

## 2020-01-12 DIAGNOSIS — N183 Chronic kidney disease, stage 3 unspecified: Secondary | ICD-10-CM | POA: Diagnosis not present

## 2020-01-12 DIAGNOSIS — N390 Urinary tract infection, site not specified: Secondary | ICD-10-CM | POA: Diagnosis not present

## 2020-01-12 DIAGNOSIS — I959 Hypotension, unspecified: Secondary | ICD-10-CM | POA: Diagnosis not present

## 2020-01-13 DIAGNOSIS — N390 Urinary tract infection, site not specified: Secondary | ICD-10-CM | POA: Diagnosis not present

## 2020-01-13 DIAGNOSIS — I959 Hypotension, unspecified: Secondary | ICD-10-CM | POA: Diagnosis not present

## 2020-01-13 DIAGNOSIS — N183 Chronic kidney disease, stage 3 unspecified: Secondary | ICD-10-CM | POA: Diagnosis not present

## 2020-01-13 DIAGNOSIS — N179 Acute kidney failure, unspecified: Secondary | ICD-10-CM | POA: Diagnosis not present

## 2020-01-13 DIAGNOSIS — D649 Anemia, unspecified: Secondary | ICD-10-CM | POA: Diagnosis not present

## 2020-01-14 DIAGNOSIS — N179 Acute kidney failure, unspecified: Secondary | ICD-10-CM | POA: Diagnosis not present

## 2020-01-14 DIAGNOSIS — I959 Hypotension, unspecified: Secondary | ICD-10-CM | POA: Diagnosis not present

## 2020-01-15 DIAGNOSIS — N179 Acute kidney failure, unspecified: Secondary | ICD-10-CM | POA: Diagnosis not present

## 2020-01-15 DIAGNOSIS — D649 Anemia, unspecified: Secondary | ICD-10-CM | POA: Diagnosis not present

## 2020-01-15 DIAGNOSIS — I959 Hypotension, unspecified: Secondary | ICD-10-CM | POA: Diagnosis not present

## 2020-01-16 DIAGNOSIS — D649 Anemia, unspecified: Secondary | ICD-10-CM | POA: Diagnosis not present

## 2020-01-16 DIAGNOSIS — N183 Chronic kidney disease, stage 3 unspecified: Secondary | ICD-10-CM | POA: Diagnosis not present

## 2020-01-16 DIAGNOSIS — I959 Hypotension, unspecified: Secondary | ICD-10-CM | POA: Diagnosis not present

## 2020-01-16 DIAGNOSIS — N179 Acute kidney failure, unspecified: Secondary | ICD-10-CM | POA: Diagnosis not present

## 2020-01-17 DIAGNOSIS — Z9981 Dependence on supplemental oxygen: Secondary | ICD-10-CM | POA: Diagnosis not present

## 2020-01-17 DIAGNOSIS — I5042 Chronic combined systolic (congestive) and diastolic (congestive) heart failure: Secondary | ICD-10-CM | POA: Diagnosis not present

## 2020-01-17 DIAGNOSIS — I2510 Atherosclerotic heart disease of native coronary artery without angina pectoris: Secondary | ICD-10-CM | POA: Diagnosis not present

## 2020-01-17 DIAGNOSIS — N179 Acute kidney failure, unspecified: Secondary | ICD-10-CM | POA: Diagnosis not present

## 2020-01-17 DIAGNOSIS — N1831 Chronic kidney disease, stage 3a: Secondary | ICD-10-CM | POA: Diagnosis not present

## 2020-01-17 DIAGNOSIS — N183 Chronic kidney disease, stage 3 unspecified: Secondary | ICD-10-CM | POA: Diagnosis not present

## 2020-01-17 DIAGNOSIS — Z7401 Bed confinement status: Secondary | ICD-10-CM | POA: Diagnosis not present

## 2020-01-17 DIAGNOSIS — R531 Weakness: Secondary | ICD-10-CM | POA: Diagnosis not present

## 2020-01-17 DIAGNOSIS — K219 Gastro-esophageal reflux disease without esophagitis: Secondary | ICD-10-CM | POA: Diagnosis not present

## 2020-01-17 DIAGNOSIS — I5020 Unspecified systolic (congestive) heart failure: Secondary | ICD-10-CM | POA: Diagnosis not present

## 2020-01-17 DIAGNOSIS — I959 Hypotension, unspecified: Secondary | ICD-10-CM | POA: Diagnosis not present

## 2020-01-17 DIAGNOSIS — J9610 Chronic respiratory failure, unspecified whether with hypoxia or hypercapnia: Secondary | ICD-10-CM | POA: Diagnosis not present

## 2020-01-17 DIAGNOSIS — M2550 Pain in unspecified joint: Secondary | ICD-10-CM | POA: Diagnosis not present

## 2020-01-17 DIAGNOSIS — A419 Sepsis, unspecified organism: Secondary | ICD-10-CM | POA: Diagnosis not present

## 2020-01-17 DIAGNOSIS — D631 Anemia in chronic kidney disease: Secondary | ICD-10-CM | POA: Diagnosis not present

## 2020-01-17 DIAGNOSIS — Z8673 Personal history of transient ischemic attack (TIA), and cerebral infarction without residual deficits: Secondary | ICD-10-CM | POA: Diagnosis not present

## 2020-01-17 DIAGNOSIS — D649 Anemia, unspecified: Secondary | ICD-10-CM | POA: Diagnosis not present

## 2020-01-17 DIAGNOSIS — E538 Deficiency of other specified B group vitamins: Secondary | ICD-10-CM | POA: Diagnosis not present

## 2020-01-17 DIAGNOSIS — N400 Enlarged prostate without lower urinary tract symptoms: Secondary | ICD-10-CM | POA: Diagnosis not present

## 2020-01-17 DIAGNOSIS — I252 Old myocardial infarction: Secondary | ICD-10-CM | POA: Diagnosis not present

## 2020-01-17 DIAGNOSIS — I700 Atherosclerosis of aorta: Secondary | ICD-10-CM | POA: Diagnosis not present

## 2020-01-17 DIAGNOSIS — J9611 Chronic respiratory failure with hypoxia: Secondary | ICD-10-CM | POA: Diagnosis not present

## 2020-01-17 DIAGNOSIS — I951 Orthostatic hypotension: Secondary | ICD-10-CM | POA: Diagnosis not present

## 2020-01-17 DIAGNOSIS — J439 Emphysema, unspecified: Secondary | ICD-10-CM | POA: Diagnosis not present

## 2020-01-17 DIAGNOSIS — I214 Non-ST elevation (NSTEMI) myocardial infarction: Secondary | ICD-10-CM | POA: Diagnosis not present

## 2020-01-17 DIAGNOSIS — I130 Hypertensive heart and chronic kidney disease with heart failure and stage 1 through stage 4 chronic kidney disease, or unspecified chronic kidney disease: Secondary | ICD-10-CM | POA: Diagnosis not present

## 2020-01-17 DIAGNOSIS — E785 Hyperlipidemia, unspecified: Secondary | ICD-10-CM | POA: Diagnosis not present

## 2020-01-17 DIAGNOSIS — Z515 Encounter for palliative care: Secondary | ICD-10-CM | POA: Diagnosis not present

## 2020-01-17 DIAGNOSIS — M25511 Pain in right shoulder: Secondary | ICD-10-CM | POA: Diagnosis not present

## 2020-01-17 DIAGNOSIS — Z79899 Other long term (current) drug therapy: Secondary | ICD-10-CM | POA: Diagnosis not present

## 2020-01-17 DIAGNOSIS — J449 Chronic obstructive pulmonary disease, unspecified: Secondary | ICD-10-CM | POA: Diagnosis not present

## 2020-01-17 DIAGNOSIS — W19XXXA Unspecified fall, initial encounter: Secondary | ICD-10-CM | POA: Diagnosis not present

## 2020-01-18 DIAGNOSIS — I5042 Chronic combined systolic (congestive) and diastolic (congestive) heart failure: Secondary | ICD-10-CM | POA: Diagnosis not present

## 2020-01-18 DIAGNOSIS — J9610 Chronic respiratory failure, unspecified whether with hypoxia or hypercapnia: Secondary | ICD-10-CM | POA: Diagnosis not present

## 2020-01-18 DIAGNOSIS — I130 Hypertensive heart and chronic kidney disease with heart failure and stage 1 through stage 4 chronic kidney disease, or unspecified chronic kidney disease: Secondary | ICD-10-CM | POA: Diagnosis not present

## 2020-01-18 DIAGNOSIS — J449 Chronic obstructive pulmonary disease, unspecified: Secondary | ICD-10-CM | POA: Diagnosis not present

## 2020-01-19 DIAGNOSIS — J449 Chronic obstructive pulmonary disease, unspecified: Secondary | ICD-10-CM | POA: Diagnosis not present

## 2020-01-19 DIAGNOSIS — I130 Hypertensive heart and chronic kidney disease with heart failure and stage 1 through stage 4 chronic kidney disease, or unspecified chronic kidney disease: Secondary | ICD-10-CM | POA: Diagnosis not present

## 2020-01-19 DIAGNOSIS — I214 Non-ST elevation (NSTEMI) myocardial infarction: Secondary | ICD-10-CM | POA: Diagnosis not present

## 2020-01-19 DIAGNOSIS — I5042 Chronic combined systolic (congestive) and diastolic (congestive) heart failure: Secondary | ICD-10-CM | POA: Diagnosis not present

## 2020-01-27 DIAGNOSIS — I214 Non-ST elevation (NSTEMI) myocardial infarction: Secondary | ICD-10-CM | POA: Diagnosis not present

## 2020-01-27 DIAGNOSIS — E785 Hyperlipidemia, unspecified: Secondary | ICD-10-CM | POA: Diagnosis not present

## 2020-01-27 DIAGNOSIS — I130 Hypertensive heart and chronic kidney disease with heart failure and stage 1 through stage 4 chronic kidney disease, or unspecified chronic kidney disease: Secondary | ICD-10-CM | POA: Diagnosis not present

## 2020-02-01 DIAGNOSIS — M2550 Pain in unspecified joint: Secondary | ICD-10-CM | POA: Diagnosis not present

## 2020-02-03 DIAGNOSIS — E538 Deficiency of other specified B group vitamins: Secondary | ICD-10-CM | POA: Diagnosis not present

## 2020-02-03 DIAGNOSIS — J9610 Chronic respiratory failure, unspecified whether with hypoxia or hypercapnia: Secondary | ICD-10-CM | POA: Diagnosis not present

## 2020-02-03 DIAGNOSIS — I214 Non-ST elevation (NSTEMI) myocardial infarction: Secondary | ICD-10-CM | POA: Diagnosis not present

## 2020-02-03 DIAGNOSIS — J439 Emphysema, unspecified: Secondary | ICD-10-CM | POA: Diagnosis not present

## 2020-02-03 DIAGNOSIS — I5042 Chronic combined systolic (congestive) and diastolic (congestive) heart failure: Secondary | ICD-10-CM | POA: Diagnosis not present

## 2020-02-03 DIAGNOSIS — N1831 Chronic kidney disease, stage 3a: Secondary | ICD-10-CM | POA: Diagnosis not present

## 2020-02-03 DIAGNOSIS — I130 Hypertensive heart and chronic kidney disease with heart failure and stage 1 through stage 4 chronic kidney disease, or unspecified chronic kidney disease: Secondary | ICD-10-CM | POA: Diagnosis not present

## 2020-02-03 DIAGNOSIS — E785 Hyperlipidemia, unspecified: Secondary | ICD-10-CM | POA: Diagnosis not present

## 2020-02-03 DIAGNOSIS — I700 Atherosclerosis of aorta: Secondary | ICD-10-CM | POA: Diagnosis not present

## 2020-02-03 DIAGNOSIS — D631 Anemia in chronic kidney disease: Secondary | ICD-10-CM | POA: Diagnosis not present

## 2020-02-04 DIAGNOSIS — D631 Anemia in chronic kidney disease: Secondary | ICD-10-CM | POA: Diagnosis not present

## 2020-02-04 DIAGNOSIS — I214 Non-ST elevation (NSTEMI) myocardial infarction: Secondary | ICD-10-CM | POA: Diagnosis not present

## 2020-02-04 DIAGNOSIS — N1831 Chronic kidney disease, stage 3a: Secondary | ICD-10-CM | POA: Diagnosis not present

## 2020-02-04 DIAGNOSIS — E785 Hyperlipidemia, unspecified: Secondary | ICD-10-CM | POA: Diagnosis not present

## 2020-02-04 DIAGNOSIS — J439 Emphysema, unspecified: Secondary | ICD-10-CM | POA: Diagnosis not present

## 2020-02-04 DIAGNOSIS — J9610 Chronic respiratory failure, unspecified whether with hypoxia or hypercapnia: Secondary | ICD-10-CM | POA: Diagnosis not present

## 2020-02-04 DIAGNOSIS — I700 Atherosclerosis of aorta: Secondary | ICD-10-CM | POA: Diagnosis not present

## 2020-02-04 DIAGNOSIS — E538 Deficiency of other specified B group vitamins: Secondary | ICD-10-CM | POA: Diagnosis not present

## 2020-02-04 DIAGNOSIS — I5042 Chronic combined systolic (congestive) and diastolic (congestive) heart failure: Secondary | ICD-10-CM | POA: Diagnosis not present

## 2020-02-04 DIAGNOSIS — I130 Hypertensive heart and chronic kidney disease with heart failure and stage 1 through stage 4 chronic kidney disease, or unspecified chronic kidney disease: Secondary | ICD-10-CM | POA: Diagnosis not present

## 2020-02-08 DIAGNOSIS — I130 Hypertensive heart and chronic kidney disease with heart failure and stage 1 through stage 4 chronic kidney disease, or unspecified chronic kidney disease: Secondary | ICD-10-CM | POA: Diagnosis not present

## 2020-02-08 DIAGNOSIS — N1831 Chronic kidney disease, stage 3a: Secondary | ICD-10-CM | POA: Diagnosis not present

## 2020-02-08 DIAGNOSIS — D631 Anemia in chronic kidney disease: Secondary | ICD-10-CM | POA: Diagnosis not present

## 2020-02-08 DIAGNOSIS — E538 Deficiency of other specified B group vitamins: Secondary | ICD-10-CM | POA: Diagnosis not present

## 2020-02-08 DIAGNOSIS — J9610 Chronic respiratory failure, unspecified whether with hypoxia or hypercapnia: Secondary | ICD-10-CM | POA: Diagnosis not present

## 2020-02-08 DIAGNOSIS — I700 Atherosclerosis of aorta: Secondary | ICD-10-CM | POA: Diagnosis not present

## 2020-02-08 DIAGNOSIS — E785 Hyperlipidemia, unspecified: Secondary | ICD-10-CM | POA: Diagnosis not present

## 2020-02-08 DIAGNOSIS — J439 Emphysema, unspecified: Secondary | ICD-10-CM | POA: Diagnosis not present

## 2020-02-08 DIAGNOSIS — I214 Non-ST elevation (NSTEMI) myocardial infarction: Secondary | ICD-10-CM | POA: Diagnosis not present

## 2020-02-08 DIAGNOSIS — I5042 Chronic combined systolic (congestive) and diastolic (congestive) heart failure: Secondary | ICD-10-CM | POA: Diagnosis not present

## 2020-02-11 DIAGNOSIS — J9610 Chronic respiratory failure, unspecified whether with hypoxia or hypercapnia: Secondary | ICD-10-CM | POA: Diagnosis not present

## 2020-02-11 DIAGNOSIS — I5042 Chronic combined systolic (congestive) and diastolic (congestive) heart failure: Secondary | ICD-10-CM | POA: Diagnosis not present

## 2020-02-11 DIAGNOSIS — I700 Atherosclerosis of aorta: Secondary | ICD-10-CM | POA: Diagnosis not present

## 2020-02-11 DIAGNOSIS — E538 Deficiency of other specified B group vitamins: Secondary | ICD-10-CM | POA: Diagnosis not present

## 2020-02-11 DIAGNOSIS — D631 Anemia in chronic kidney disease: Secondary | ICD-10-CM | POA: Diagnosis not present

## 2020-02-11 DIAGNOSIS — I130 Hypertensive heart and chronic kidney disease with heart failure and stage 1 through stage 4 chronic kidney disease, or unspecified chronic kidney disease: Secondary | ICD-10-CM | POA: Diagnosis not present

## 2020-02-11 DIAGNOSIS — N1831 Chronic kidney disease, stage 3a: Secondary | ICD-10-CM | POA: Diagnosis not present

## 2020-02-11 DIAGNOSIS — E785 Hyperlipidemia, unspecified: Secondary | ICD-10-CM | POA: Diagnosis not present

## 2020-02-11 DIAGNOSIS — I214 Non-ST elevation (NSTEMI) myocardial infarction: Secondary | ICD-10-CM | POA: Diagnosis not present

## 2020-02-11 DIAGNOSIS — J439 Emphysema, unspecified: Secondary | ICD-10-CM | POA: Diagnosis not present

## 2020-02-14 DIAGNOSIS — N1831 Chronic kidney disease, stage 3a: Secondary | ICD-10-CM | POA: Diagnosis not present

## 2020-02-14 DIAGNOSIS — J9610 Chronic respiratory failure, unspecified whether with hypoxia or hypercapnia: Secondary | ICD-10-CM | POA: Diagnosis not present

## 2020-02-14 DIAGNOSIS — I214 Non-ST elevation (NSTEMI) myocardial infarction: Secondary | ICD-10-CM | POA: Diagnosis not present

## 2020-02-14 DIAGNOSIS — E538 Deficiency of other specified B group vitamins: Secondary | ICD-10-CM | POA: Diagnosis not present

## 2020-02-14 DIAGNOSIS — D631 Anemia in chronic kidney disease: Secondary | ICD-10-CM | POA: Diagnosis not present

## 2020-02-14 DIAGNOSIS — E785 Hyperlipidemia, unspecified: Secondary | ICD-10-CM | POA: Diagnosis not present

## 2020-02-14 DIAGNOSIS — I700 Atherosclerosis of aorta: Secondary | ICD-10-CM | POA: Diagnosis not present

## 2020-02-14 DIAGNOSIS — J439 Emphysema, unspecified: Secondary | ICD-10-CM | POA: Diagnosis not present

## 2020-02-14 DIAGNOSIS — I130 Hypertensive heart and chronic kidney disease with heart failure and stage 1 through stage 4 chronic kidney disease, or unspecified chronic kidney disease: Secondary | ICD-10-CM | POA: Diagnosis not present

## 2020-02-14 DIAGNOSIS — I5042 Chronic combined systolic (congestive) and diastolic (congestive) heart failure: Secondary | ICD-10-CM | POA: Diagnosis not present

## 2020-02-17 DIAGNOSIS — D631 Anemia in chronic kidney disease: Secondary | ICD-10-CM | POA: Diagnosis not present

## 2020-02-17 DIAGNOSIS — I5042 Chronic combined systolic (congestive) and diastolic (congestive) heart failure: Secondary | ICD-10-CM | POA: Diagnosis not present

## 2020-02-17 DIAGNOSIS — I700 Atherosclerosis of aorta: Secondary | ICD-10-CM | POA: Diagnosis not present

## 2020-02-17 DIAGNOSIS — E785 Hyperlipidemia, unspecified: Secondary | ICD-10-CM | POA: Diagnosis not present

## 2020-02-17 DIAGNOSIS — I130 Hypertensive heart and chronic kidney disease with heart failure and stage 1 through stage 4 chronic kidney disease, or unspecified chronic kidney disease: Secondary | ICD-10-CM | POA: Diagnosis not present

## 2020-02-17 DIAGNOSIS — N1831 Chronic kidney disease, stage 3a: Secondary | ICD-10-CM | POA: Diagnosis not present

## 2020-02-17 DIAGNOSIS — E538 Deficiency of other specified B group vitamins: Secondary | ICD-10-CM | POA: Diagnosis not present

## 2020-02-17 DIAGNOSIS — J439 Emphysema, unspecified: Secondary | ICD-10-CM | POA: Diagnosis not present

## 2020-02-17 DIAGNOSIS — I214 Non-ST elevation (NSTEMI) myocardial infarction: Secondary | ICD-10-CM | POA: Diagnosis not present

## 2020-02-17 DIAGNOSIS — J9610 Chronic respiratory failure, unspecified whether with hypoxia or hypercapnia: Secondary | ICD-10-CM | POA: Diagnosis not present

## 2020-02-21 DIAGNOSIS — K219 Gastro-esophageal reflux disease without esophagitis: Secondary | ICD-10-CM | POA: Diagnosis not present

## 2020-02-21 DIAGNOSIS — R69 Illness, unspecified: Secondary | ICD-10-CM | POA: Diagnosis not present

## 2020-02-21 DIAGNOSIS — I10 Essential (primary) hypertension: Secondary | ICD-10-CM | POA: Diagnosis not present

## 2020-02-21 DIAGNOSIS — G4730 Sleep apnea, unspecified: Secondary | ICD-10-CM | POA: Diagnosis not present

## 2020-02-21 DIAGNOSIS — M069 Rheumatoid arthritis, unspecified: Secondary | ICD-10-CM | POA: Diagnosis not present

## 2020-02-21 DIAGNOSIS — R0902 Hypoxemia: Secondary | ICD-10-CM | POA: Diagnosis not present

## 2020-02-21 DIAGNOSIS — I672 Cerebral atherosclerosis: Secondary | ICD-10-CM | POA: Diagnosis not present

## 2020-02-21 DIAGNOSIS — G4700 Insomnia, unspecified: Secondary | ICD-10-CM | POA: Diagnosis not present

## 2020-02-21 DIAGNOSIS — J449 Chronic obstructive pulmonary disease, unspecified: Secondary | ICD-10-CM | POA: Diagnosis not present

## 2020-02-21 DIAGNOSIS — R109 Unspecified abdominal pain: Secondary | ICD-10-CM | POA: Diagnosis not present

## 2020-02-22 DIAGNOSIS — I5042 Chronic combined systolic (congestive) and diastolic (congestive) heart failure: Secondary | ICD-10-CM | POA: Diagnosis not present

## 2020-02-22 DIAGNOSIS — D631 Anemia in chronic kidney disease: Secondary | ICD-10-CM | POA: Diagnosis not present

## 2020-02-22 DIAGNOSIS — I130 Hypertensive heart and chronic kidney disease with heart failure and stage 1 through stage 4 chronic kidney disease, or unspecified chronic kidney disease: Secondary | ICD-10-CM | POA: Diagnosis not present

## 2020-02-22 DIAGNOSIS — J439 Emphysema, unspecified: Secondary | ICD-10-CM | POA: Diagnosis not present

## 2020-02-22 DIAGNOSIS — N1831 Chronic kidney disease, stage 3a: Secondary | ICD-10-CM | POA: Diagnosis not present

## 2020-02-22 DIAGNOSIS — I214 Non-ST elevation (NSTEMI) myocardial infarction: Secondary | ICD-10-CM | POA: Diagnosis not present

## 2020-02-22 DIAGNOSIS — I700 Atherosclerosis of aorta: Secondary | ICD-10-CM | POA: Diagnosis not present

## 2020-02-22 DIAGNOSIS — E785 Hyperlipidemia, unspecified: Secondary | ICD-10-CM | POA: Diagnosis not present

## 2020-02-22 DIAGNOSIS — J9610 Chronic respiratory failure, unspecified whether with hypoxia or hypercapnia: Secondary | ICD-10-CM | POA: Diagnosis not present

## 2020-02-22 DIAGNOSIS — E538 Deficiency of other specified B group vitamins: Secondary | ICD-10-CM | POA: Diagnosis not present

## 2020-03-02 DIAGNOSIS — N1831 Chronic kidney disease, stage 3a: Secondary | ICD-10-CM | POA: Diagnosis not present

## 2020-03-02 DIAGNOSIS — I272 Pulmonary hypertension, unspecified: Secondary | ICD-10-CM | POA: Diagnosis not present

## 2020-03-02 DIAGNOSIS — I130 Hypertensive heart and chronic kidney disease with heart failure and stage 1 through stage 4 chronic kidney disease, or unspecified chronic kidney disease: Secondary | ICD-10-CM | POA: Diagnosis not present

## 2020-03-02 DIAGNOSIS — I214 Non-ST elevation (NSTEMI) myocardial infarction: Secondary | ICD-10-CM | POA: Diagnosis not present

## 2020-03-02 DIAGNOSIS — R55 Syncope and collapse: Secondary | ICD-10-CM | POA: Diagnosis not present

## 2020-03-02 DIAGNOSIS — I255 Ischemic cardiomyopathy: Secondary | ICD-10-CM | POA: Diagnosis not present

## 2020-03-02 DIAGNOSIS — J9611 Chronic respiratory failure with hypoxia: Secondary | ICD-10-CM | POA: Diagnosis not present

## 2020-03-02 DIAGNOSIS — Z8679 Personal history of other diseases of the circulatory system: Secondary | ICD-10-CM | POA: Diagnosis not present

## 2020-03-02 DIAGNOSIS — J449 Chronic obstructive pulmonary disease, unspecified: Secondary | ICD-10-CM | POA: Diagnosis not present

## 2020-03-02 DIAGNOSIS — I420 Dilated cardiomyopathy: Secondary | ICD-10-CM | POA: Diagnosis not present

## 2020-03-02 DIAGNOSIS — R0602 Shortness of breath: Secondary | ICD-10-CM | POA: Diagnosis not present

## 2020-03-02 DIAGNOSIS — Z9889 Other specified postprocedural states: Secondary | ICD-10-CM | POA: Diagnosis not present

## 2020-03-03 DIAGNOSIS — I130 Hypertensive heart and chronic kidney disease with heart failure and stage 1 through stage 4 chronic kidney disease, or unspecified chronic kidney disease: Secondary | ICD-10-CM | POA: Diagnosis not present

## 2020-03-03 DIAGNOSIS — J439 Emphysema, unspecified: Secondary | ICD-10-CM | POA: Diagnosis not present

## 2020-03-03 DIAGNOSIS — J9610 Chronic respiratory failure, unspecified whether with hypoxia or hypercapnia: Secondary | ICD-10-CM | POA: Diagnosis not present

## 2020-03-03 DIAGNOSIS — N1831 Chronic kidney disease, stage 3a: Secondary | ICD-10-CM | POA: Diagnosis not present

## 2020-03-03 DIAGNOSIS — I214 Non-ST elevation (NSTEMI) myocardial infarction: Secondary | ICD-10-CM | POA: Diagnosis not present

## 2020-03-03 DIAGNOSIS — I700 Atherosclerosis of aorta: Secondary | ICD-10-CM | POA: Diagnosis not present

## 2020-03-03 DIAGNOSIS — I5042 Chronic combined systolic (congestive) and diastolic (congestive) heart failure: Secondary | ICD-10-CM | POA: Diagnosis not present

## 2020-03-03 DIAGNOSIS — D631 Anemia in chronic kidney disease: Secondary | ICD-10-CM | POA: Diagnosis not present

## 2020-03-03 DIAGNOSIS — E538 Deficiency of other specified B group vitamins: Secondary | ICD-10-CM | POA: Diagnosis not present

## 2020-03-03 DIAGNOSIS — E785 Hyperlipidemia, unspecified: Secondary | ICD-10-CM | POA: Diagnosis not present

## 2020-03-16 DIAGNOSIS — J439 Emphysema, unspecified: Secondary | ICD-10-CM | POA: Diagnosis not present

## 2020-03-16 DIAGNOSIS — J9610 Chronic respiratory failure, unspecified whether with hypoxia or hypercapnia: Secondary | ICD-10-CM | POA: Diagnosis not present

## 2020-03-16 DIAGNOSIS — D631 Anemia in chronic kidney disease: Secondary | ICD-10-CM | POA: Diagnosis not present

## 2020-03-16 DIAGNOSIS — I214 Non-ST elevation (NSTEMI) myocardial infarction: Secondary | ICD-10-CM | POA: Diagnosis not present

## 2020-03-16 DIAGNOSIS — I5042 Chronic combined systolic (congestive) and diastolic (congestive) heart failure: Secondary | ICD-10-CM | POA: Diagnosis not present

## 2020-03-16 DIAGNOSIS — N1831 Chronic kidney disease, stage 3a: Secondary | ICD-10-CM | POA: Diagnosis not present

## 2020-03-16 DIAGNOSIS — I130 Hypertensive heart and chronic kidney disease with heart failure and stage 1 through stage 4 chronic kidney disease, or unspecified chronic kidney disease: Secondary | ICD-10-CM | POA: Diagnosis not present

## 2020-03-16 DIAGNOSIS — I700 Atherosclerosis of aorta: Secondary | ICD-10-CM | POA: Diagnosis not present

## 2020-03-16 DIAGNOSIS — E785 Hyperlipidemia, unspecified: Secondary | ICD-10-CM | POA: Diagnosis not present

## 2020-03-16 DIAGNOSIS — E538 Deficiency of other specified B group vitamins: Secondary | ICD-10-CM | POA: Diagnosis not present

## 2020-04-13 DIAGNOSIS — R109 Unspecified abdominal pain: Secondary | ICD-10-CM | POA: Diagnosis not present

## 2020-04-13 DIAGNOSIS — R0902 Hypoxemia: Secondary | ICD-10-CM | POA: Diagnosis not present

## 2020-04-13 DIAGNOSIS — M069 Rheumatoid arthritis, unspecified: Secondary | ICD-10-CM | POA: Diagnosis not present

## 2020-04-13 DIAGNOSIS — G4730 Sleep apnea, unspecified: Secondary | ICD-10-CM | POA: Diagnosis not present

## 2020-04-13 DIAGNOSIS — I10 Essential (primary) hypertension: Secondary | ICD-10-CM | POA: Diagnosis not present

## 2020-04-13 DIAGNOSIS — I509 Heart failure, unspecified: Secondary | ICD-10-CM | POA: Diagnosis not present

## 2020-04-13 DIAGNOSIS — G4700 Insomnia, unspecified: Secondary | ICD-10-CM | POA: Diagnosis not present

## 2020-04-13 DIAGNOSIS — J449 Chronic obstructive pulmonary disease, unspecified: Secondary | ICD-10-CM | POA: Diagnosis not present

## 2020-04-13 DIAGNOSIS — I672 Cerebral atherosclerosis: Secondary | ICD-10-CM | POA: Diagnosis not present

## 2020-04-13 DIAGNOSIS — K219 Gastro-esophageal reflux disease without esophagitis: Secondary | ICD-10-CM | POA: Diagnosis not present

## 2020-04-16 DIAGNOSIS — I739 Peripheral vascular disease, unspecified: Secondary | ICD-10-CM | POA: Diagnosis not present

## 2020-04-16 DIAGNOSIS — I132 Hypertensive heart and chronic kidney disease with heart failure and with stage 5 chronic kidney disease, or end stage renal disease: Secondary | ICD-10-CM | POA: Diagnosis not present

## 2020-04-16 DIAGNOSIS — I5042 Chronic combined systolic (congestive) and diastolic (congestive) heart failure: Secondary | ICD-10-CM | POA: Diagnosis not present

## 2020-04-16 DIAGNOSIS — M5412 Radiculopathy, cervical region: Secondary | ICD-10-CM | POA: Diagnosis not present

## 2020-04-16 DIAGNOSIS — G8929 Other chronic pain: Secondary | ICD-10-CM | POA: Diagnosis not present

## 2020-04-16 DIAGNOSIS — D631 Anemia in chronic kidney disease: Secondary | ICD-10-CM | POA: Diagnosis not present

## 2020-04-16 DIAGNOSIS — M5136 Other intervertebral disc degeneration, lumbar region: Secondary | ICD-10-CM | POA: Diagnosis not present

## 2020-04-16 DIAGNOSIS — J439 Emphysema, unspecified: Secondary | ICD-10-CM | POA: Diagnosis not present

## 2020-04-16 DIAGNOSIS — J9611 Chronic respiratory failure with hypoxia: Secondary | ICD-10-CM | POA: Diagnosis not present

## 2020-04-16 DIAGNOSIS — N185 Chronic kidney disease, stage 5: Secondary | ICD-10-CM | POA: Diagnosis not present

## 2020-04-17 ENCOUNTER — Ambulatory Visit
Admission: RE | Admit: 2020-04-17 | Discharge: 2020-04-17 | Disposition: A | Discharge status: Home / Self Care | Payer: Medicare HMO | Source: Ambulatory Visit | Attending: Internal Medicine | Admitting: Internal Medicine

## 2020-04-17 ENCOUNTER — Other Ambulatory Visit: Payer: Self-pay | Admitting: Internal Medicine

## 2020-04-17 ENCOUNTER — Other Ambulatory Visit: Payer: Medicare HMO

## 2020-04-17 DIAGNOSIS — N19 Unspecified kidney failure: Secondary | ICD-10-CM | POA: Diagnosis not present

## 2020-04-17 DIAGNOSIS — R634 Abnormal weight loss: Secondary | ICD-10-CM

## 2020-04-17 DIAGNOSIS — J8410 Pulmonary fibrosis, unspecified: Secondary | ICD-10-CM

## 2020-04-17 DIAGNOSIS — R110 Nausea: Secondary | ICD-10-CM | POA: Diagnosis not present

## 2020-04-17 DIAGNOSIS — R05 Cough: Secondary | ICD-10-CM | POA: Diagnosis not present

## 2020-04-17 DIAGNOSIS — J449 Chronic obstructive pulmonary disease, unspecified: Secondary | ICD-10-CM | POA: Diagnosis not present

## 2020-04-17 DIAGNOSIS — I509 Heart failure, unspecified: Secondary | ICD-10-CM | POA: Diagnosis not present

## 2020-04-19 DIAGNOSIS — N185 Chronic kidney disease, stage 5: Secondary | ICD-10-CM | POA: Diagnosis not present

## 2020-04-19 DIAGNOSIS — N281 Cyst of kidney, acquired: Secondary | ICD-10-CM | POA: Diagnosis not present

## 2020-04-25 DIAGNOSIS — I5042 Chronic combined systolic (congestive) and diastolic (congestive) heart failure: Secondary | ICD-10-CM | POA: Diagnosis not present

## 2020-04-25 DIAGNOSIS — G8929 Other chronic pain: Secondary | ICD-10-CM | POA: Diagnosis not present

## 2020-04-25 DIAGNOSIS — N185 Chronic kidney disease, stage 5: Secondary | ICD-10-CM | POA: Diagnosis not present

## 2020-04-25 DIAGNOSIS — I739 Peripheral vascular disease, unspecified: Secondary | ICD-10-CM | POA: Diagnosis not present

## 2020-04-25 DIAGNOSIS — M5412 Radiculopathy, cervical region: Secondary | ICD-10-CM | POA: Diagnosis not present

## 2020-04-25 DIAGNOSIS — I132 Hypertensive heart and chronic kidney disease with heart failure and with stage 5 chronic kidney disease, or end stage renal disease: Secondary | ICD-10-CM | POA: Diagnosis not present

## 2020-04-25 DIAGNOSIS — J9611 Chronic respiratory failure with hypoxia: Secondary | ICD-10-CM | POA: Diagnosis not present

## 2020-04-25 DIAGNOSIS — J439 Emphysema, unspecified: Secondary | ICD-10-CM | POA: Diagnosis not present

## 2020-04-25 DIAGNOSIS — M5136 Other intervertebral disc degeneration, lumbar region: Secondary | ICD-10-CM | POA: Diagnosis not present

## 2020-04-25 DIAGNOSIS — D631 Anemia in chronic kidney disease: Secondary | ICD-10-CM | POA: Diagnosis not present

## 2020-05-02 DIAGNOSIS — J439 Emphysema, unspecified: Secondary | ICD-10-CM | POA: Diagnosis not present

## 2020-05-02 DIAGNOSIS — M5412 Radiculopathy, cervical region: Secondary | ICD-10-CM | POA: Diagnosis not present

## 2020-05-02 DIAGNOSIS — J9611 Chronic respiratory failure with hypoxia: Secondary | ICD-10-CM | POA: Diagnosis not present

## 2020-05-02 DIAGNOSIS — D631 Anemia in chronic kidney disease: Secondary | ICD-10-CM | POA: Diagnosis not present

## 2020-05-02 DIAGNOSIS — M5136 Other intervertebral disc degeneration, lumbar region: Secondary | ICD-10-CM | POA: Diagnosis not present

## 2020-05-02 DIAGNOSIS — I739 Peripheral vascular disease, unspecified: Secondary | ICD-10-CM | POA: Diagnosis not present

## 2020-05-02 DIAGNOSIS — N185 Chronic kidney disease, stage 5: Secondary | ICD-10-CM | POA: Diagnosis not present

## 2020-05-02 DIAGNOSIS — I132 Hypertensive heart and chronic kidney disease with heart failure and with stage 5 chronic kidney disease, or end stage renal disease: Secondary | ICD-10-CM | POA: Diagnosis not present

## 2020-05-02 DIAGNOSIS — I5042 Chronic combined systolic (congestive) and diastolic (congestive) heart failure: Secondary | ICD-10-CM | POA: Diagnosis not present

## 2020-05-02 DIAGNOSIS — G8929 Other chronic pain: Secondary | ICD-10-CM | POA: Diagnosis not present

## 2020-05-04 DIAGNOSIS — I5042 Chronic combined systolic (congestive) and diastolic (congestive) heart failure: Secondary | ICD-10-CM | POA: Diagnosis not present

## 2020-05-04 DIAGNOSIS — I132 Hypertensive heart and chronic kidney disease with heart failure and with stage 5 chronic kidney disease, or end stage renal disease: Secondary | ICD-10-CM | POA: Diagnosis not present

## 2020-05-04 DIAGNOSIS — I739 Peripheral vascular disease, unspecified: Secondary | ICD-10-CM | POA: Diagnosis not present

## 2020-05-04 DIAGNOSIS — J9611 Chronic respiratory failure with hypoxia: Secondary | ICD-10-CM | POA: Diagnosis not present

## 2020-05-04 DIAGNOSIS — N185 Chronic kidney disease, stage 5: Secondary | ICD-10-CM | POA: Diagnosis not present

## 2020-05-04 DIAGNOSIS — D631 Anemia in chronic kidney disease: Secondary | ICD-10-CM | POA: Diagnosis not present

## 2020-05-04 DIAGNOSIS — M5136 Other intervertebral disc degeneration, lumbar region: Secondary | ICD-10-CM | POA: Diagnosis not present

## 2020-05-04 DIAGNOSIS — M5412 Radiculopathy, cervical region: Secondary | ICD-10-CM | POA: Diagnosis not present

## 2020-05-04 DIAGNOSIS — G8929 Other chronic pain: Secondary | ICD-10-CM | POA: Diagnosis not present

## 2020-05-04 DIAGNOSIS — J439 Emphysema, unspecified: Secondary | ICD-10-CM | POA: Diagnosis not present

## 2020-05-10 DIAGNOSIS — I739 Peripheral vascular disease, unspecified: Secondary | ICD-10-CM | POA: Diagnosis not present

## 2020-05-10 DIAGNOSIS — M5412 Radiculopathy, cervical region: Secondary | ICD-10-CM | POA: Diagnosis not present

## 2020-05-10 DIAGNOSIS — G8929 Other chronic pain: Secondary | ICD-10-CM | POA: Diagnosis not present

## 2020-05-10 DIAGNOSIS — M5136 Other intervertebral disc degeneration, lumbar region: Secondary | ICD-10-CM | POA: Diagnosis not present

## 2020-05-10 DIAGNOSIS — N185 Chronic kidney disease, stage 5: Secondary | ICD-10-CM | POA: Diagnosis not present

## 2020-05-10 DIAGNOSIS — J9611 Chronic respiratory failure with hypoxia: Secondary | ICD-10-CM | POA: Diagnosis not present

## 2020-05-10 DIAGNOSIS — J439 Emphysema, unspecified: Secondary | ICD-10-CM | POA: Diagnosis not present

## 2020-05-10 DIAGNOSIS — I132 Hypertensive heart and chronic kidney disease with heart failure and with stage 5 chronic kidney disease, or end stage renal disease: Secondary | ICD-10-CM | POA: Diagnosis not present

## 2020-05-10 DIAGNOSIS — D631 Anemia in chronic kidney disease: Secondary | ICD-10-CM | POA: Diagnosis not present

## 2020-05-10 DIAGNOSIS — I5042 Chronic combined systolic (congestive) and diastolic (congestive) heart failure: Secondary | ICD-10-CM | POA: Diagnosis not present

## 2020-05-22 DIAGNOSIS — J449 Chronic obstructive pulmonary disease, unspecified: Secondary | ICD-10-CM | POA: Diagnosis not present

## 2020-05-22 DIAGNOSIS — N401 Enlarged prostate with lower urinary tract symptoms: Secondary | ICD-10-CM | POA: Diagnosis not present

## 2020-05-22 DIAGNOSIS — E611 Iron deficiency: Secondary | ICD-10-CM | POA: Diagnosis not present

## 2020-05-22 DIAGNOSIS — N19 Unspecified kidney failure: Secondary | ICD-10-CM | POA: Diagnosis not present

## 2020-05-22 DIAGNOSIS — R69 Illness, unspecified: Secondary | ICD-10-CM | POA: Diagnosis not present

## 2020-05-22 DIAGNOSIS — I672 Cerebral atherosclerosis: Secondary | ICD-10-CM | POA: Diagnosis not present

## 2020-05-22 DIAGNOSIS — J8410 Pulmonary fibrosis, unspecified: Secondary | ICD-10-CM | POA: Diagnosis not present

## 2020-05-22 DIAGNOSIS — E559 Vitamin D deficiency, unspecified: Secondary | ICD-10-CM | POA: Diagnosis not present

## 2020-05-22 DIAGNOSIS — N185 Chronic kidney disease, stage 5: Secondary | ICD-10-CM | POA: Diagnosis not present

## 2020-05-22 DIAGNOSIS — I509 Heart failure, unspecified: Secondary | ICD-10-CM | POA: Diagnosis not present

## 2020-05-26 DIAGNOSIS — I739 Peripheral vascular disease, unspecified: Secondary | ICD-10-CM | POA: Diagnosis not present

## 2020-05-26 DIAGNOSIS — J439 Emphysema, unspecified: Secondary | ICD-10-CM | POA: Diagnosis not present

## 2020-05-26 DIAGNOSIS — N185 Chronic kidney disease, stage 5: Secondary | ICD-10-CM | POA: Diagnosis not present

## 2020-05-26 DIAGNOSIS — M5412 Radiculopathy, cervical region: Secondary | ICD-10-CM | POA: Diagnosis not present

## 2020-05-26 DIAGNOSIS — J9611 Chronic respiratory failure with hypoxia: Secondary | ICD-10-CM | POA: Diagnosis not present

## 2020-05-26 DIAGNOSIS — I132 Hypertensive heart and chronic kidney disease with heart failure and with stage 5 chronic kidney disease, or end stage renal disease: Secondary | ICD-10-CM | POA: Diagnosis not present

## 2020-05-26 DIAGNOSIS — I5042 Chronic combined systolic (congestive) and diastolic (congestive) heart failure: Secondary | ICD-10-CM | POA: Diagnosis not present

## 2020-05-26 DIAGNOSIS — G8929 Other chronic pain: Secondary | ICD-10-CM | POA: Diagnosis not present

## 2020-05-26 DIAGNOSIS — M5136 Other intervertebral disc degeneration, lumbar region: Secondary | ICD-10-CM | POA: Diagnosis not present

## 2020-05-26 DIAGNOSIS — D631 Anemia in chronic kidney disease: Secondary | ICD-10-CM | POA: Diagnosis not present

## 2020-06-08 DIAGNOSIS — Z9889 Other specified postprocedural states: Secondary | ICD-10-CM | POA: Diagnosis not present

## 2020-06-08 DIAGNOSIS — I951 Orthostatic hypotension: Secondary | ICD-10-CM | POA: Diagnosis not present

## 2020-06-08 DIAGNOSIS — N1831 Chronic kidney disease, stage 3a: Secondary | ICD-10-CM | POA: Diagnosis not present

## 2020-06-08 DIAGNOSIS — I420 Dilated cardiomyopathy: Secondary | ICD-10-CM | POA: Diagnosis not present

## 2020-06-08 DIAGNOSIS — R0602 Shortness of breath: Secondary | ICD-10-CM | POA: Diagnosis not present

## 2020-06-08 DIAGNOSIS — I214 Non-ST elevation (NSTEMI) myocardial infarction: Secondary | ICD-10-CM | POA: Diagnosis not present

## 2020-06-08 DIAGNOSIS — J449 Chronic obstructive pulmonary disease, unspecified: Secondary | ICD-10-CM | POA: Diagnosis not present

## 2020-06-08 DIAGNOSIS — I272 Pulmonary hypertension, unspecified: Secondary | ICD-10-CM | POA: Diagnosis not present

## 2020-06-08 DIAGNOSIS — I255 Ischemic cardiomyopathy: Secondary | ICD-10-CM | POA: Diagnosis not present

## 2020-07-27 DIAGNOSIS — J449 Chronic obstructive pulmonary disease, unspecified: Secondary | ICD-10-CM | POA: Diagnosis not present

## 2020-07-27 DIAGNOSIS — I255 Ischemic cardiomyopathy: Secondary | ICD-10-CM | POA: Diagnosis not present

## 2020-07-27 DIAGNOSIS — I272 Pulmonary hypertension, unspecified: Secondary | ICD-10-CM | POA: Diagnosis not present

## 2020-07-27 DIAGNOSIS — I420 Dilated cardiomyopathy: Secondary | ICD-10-CM | POA: Diagnosis not present

## 2020-07-27 DIAGNOSIS — I214 Non-ST elevation (NSTEMI) myocardial infarction: Secondary | ICD-10-CM | POA: Diagnosis not present

## 2020-07-28 DIAGNOSIS — J449 Chronic obstructive pulmonary disease, unspecified: Secondary | ICD-10-CM | POA: Diagnosis not present

## 2020-07-28 DIAGNOSIS — K219 Gastro-esophageal reflux disease without esophagitis: Secondary | ICD-10-CM | POA: Diagnosis not present

## 2020-07-28 DIAGNOSIS — J8410 Pulmonary fibrosis, unspecified: Secondary | ICD-10-CM | POA: Diagnosis not present

## 2020-07-28 DIAGNOSIS — E611 Iron deficiency: Secondary | ICD-10-CM | POA: Diagnosis not present

## 2020-07-28 DIAGNOSIS — R69 Illness, unspecified: Secondary | ICD-10-CM | POA: Diagnosis not present

## 2020-07-28 DIAGNOSIS — N401 Enlarged prostate with lower urinary tract symptoms: Secondary | ICD-10-CM | POA: Diagnosis not present

## 2020-07-28 DIAGNOSIS — I509 Heart failure, unspecified: Secondary | ICD-10-CM | POA: Diagnosis not present

## 2020-07-28 DIAGNOSIS — N185 Chronic kidney disease, stage 5: Secondary | ICD-10-CM | POA: Diagnosis not present

## 2020-07-28 DIAGNOSIS — I672 Cerebral atherosclerosis: Secondary | ICD-10-CM | POA: Diagnosis not present

## 2020-07-28 DIAGNOSIS — E559 Vitamin D deficiency, unspecified: Secondary | ICD-10-CM | POA: Diagnosis not present

## 2020-09-21 DIAGNOSIS — M545 Low back pain, unspecified: Secondary | ICD-10-CM | POA: Diagnosis not present

## 2020-09-21 DIAGNOSIS — M62830 Muscle spasm of back: Secondary | ICD-10-CM | POA: Diagnosis not present

## 2020-11-13 DIAGNOSIS — K5730 Diverticulosis of large intestine without perforation or abscess without bleeding: Secondary | ICD-10-CM | POA: Diagnosis not present

## 2020-11-13 DIAGNOSIS — J8410 Pulmonary fibrosis, unspecified: Secondary | ICD-10-CM | POA: Diagnosis not present

## 2020-11-13 DIAGNOSIS — I959 Hypotension, unspecified: Secondary | ICD-10-CM | POA: Diagnosis not present

## 2020-11-13 DIAGNOSIS — D649 Anemia, unspecified: Secondary | ICD-10-CM | POA: Diagnosis not present

## 2020-11-13 DIAGNOSIS — N179 Acute kidney failure, unspecified: Secondary | ICD-10-CM | POA: Diagnosis not present

## 2020-11-13 DIAGNOSIS — N178 Other acute kidney failure: Secondary | ICD-10-CM | POA: Diagnosis not present

## 2020-11-13 DIAGNOSIS — E559 Vitamin D deficiency, unspecified: Secondary | ICD-10-CM | POA: Diagnosis not present

## 2020-11-13 DIAGNOSIS — B9689 Other specified bacterial agents as the cause of diseases classified elsewhere: Secondary | ICD-10-CM | POA: Diagnosis not present

## 2020-11-13 DIAGNOSIS — N138 Other obstructive and reflux uropathy: Secondary | ICD-10-CM | POA: Diagnosis not present

## 2020-11-13 DIAGNOSIS — R9431 Abnormal electrocardiogram [ECG] [EKG]: Secondary | ICD-10-CM | POA: Diagnosis not present

## 2020-11-13 DIAGNOSIS — Z7902 Long term (current) use of antithrombotics/antiplatelets: Secondary | ICD-10-CM | POA: Diagnosis not present

## 2020-11-13 DIAGNOSIS — F32A Depression, unspecified: Secondary | ICD-10-CM | POA: Diagnosis not present

## 2020-11-13 DIAGNOSIS — N390 Urinary tract infection, site not specified: Secondary | ICD-10-CM | POA: Diagnosis not present

## 2020-11-13 DIAGNOSIS — K760 Fatty (change of) liver, not elsewhere classified: Secondary | ICD-10-CM | POA: Diagnosis not present

## 2020-11-13 DIAGNOSIS — E611 Iron deficiency: Secondary | ICD-10-CM | POA: Diagnosis not present

## 2020-11-13 DIAGNOSIS — M545 Low back pain, unspecified: Secondary | ICD-10-CM | POA: Diagnosis not present

## 2020-11-13 DIAGNOSIS — N185 Chronic kidney disease, stage 5: Secondary | ICD-10-CM | POA: Diagnosis not present

## 2020-11-13 DIAGNOSIS — J9611 Chronic respiratory failure with hypoxia: Secondary | ICD-10-CM | POA: Diagnosis not present

## 2020-11-13 DIAGNOSIS — I272 Pulmonary hypertension, unspecified: Secondary | ICD-10-CM | POA: Diagnosis not present

## 2020-11-13 DIAGNOSIS — I2510 Atherosclerotic heart disease of native coronary artery without angina pectoris: Secondary | ICD-10-CM | POA: Diagnosis not present

## 2020-11-13 DIAGNOSIS — N189 Chronic kidney disease, unspecified: Secondary | ICD-10-CM | POA: Diagnosis not present

## 2020-11-13 DIAGNOSIS — K5900 Constipation, unspecified: Secondary | ICD-10-CM | POA: Diagnosis not present

## 2020-11-13 DIAGNOSIS — N183 Chronic kidney disease, stage 3 unspecified: Secondary | ICD-10-CM | POA: Diagnosis not present

## 2020-11-13 DIAGNOSIS — M1990 Unspecified osteoarthritis, unspecified site: Secondary | ICD-10-CM | POA: Diagnosis not present

## 2020-11-13 DIAGNOSIS — I509 Heart failure, unspecified: Secondary | ICD-10-CM | POA: Diagnosis not present

## 2020-11-13 DIAGNOSIS — J449 Chronic obstructive pulmonary disease, unspecified: Secondary | ICD-10-CM | POA: Diagnosis not present

## 2020-11-13 DIAGNOSIS — I252 Old myocardial infarction: Secondary | ICD-10-CM | POA: Diagnosis not present

## 2020-11-13 DIAGNOSIS — I498 Other specified cardiac arrhythmias: Secondary | ICD-10-CM | POA: Diagnosis not present

## 2020-11-13 DIAGNOSIS — R0902 Hypoxemia: Secondary | ICD-10-CM | POA: Diagnosis not present

## 2020-11-13 DIAGNOSIS — K219 Gastro-esophageal reflux disease without esophagitis: Secondary | ICD-10-CM | POA: Diagnosis not present

## 2020-11-13 DIAGNOSIS — I672 Cerebral atherosclerosis: Secondary | ICD-10-CM | POA: Diagnosis not present

## 2020-11-13 DIAGNOSIS — I10 Essential (primary) hypertension: Secondary | ICD-10-CM | POA: Diagnosis not present

## 2020-11-13 DIAGNOSIS — E875 Hyperkalemia: Secondary | ICD-10-CM | POA: Diagnosis not present

## 2020-11-13 DIAGNOSIS — I255 Ischemic cardiomyopathy: Secondary | ICD-10-CM | POA: Diagnosis not present

## 2020-11-13 DIAGNOSIS — N3001 Acute cystitis with hematuria: Secondary | ICD-10-CM | POA: Diagnosis not present

## 2020-11-13 DIAGNOSIS — Z0000 Encounter for general adult medical examination without abnormal findings: Secondary | ICD-10-CM | POA: Diagnosis not present

## 2020-11-13 DIAGNOSIS — E785 Hyperlipidemia, unspecified: Secondary | ICD-10-CM | POA: Diagnosis not present

## 2020-11-29 DIAGNOSIS — I509 Heart failure, unspecified: Secondary | ICD-10-CM | POA: Diagnosis not present

## 2020-11-29 DIAGNOSIS — J8410 Pulmonary fibrosis, unspecified: Secondary | ICD-10-CM | POA: Diagnosis not present

## 2020-11-29 DIAGNOSIS — J449 Chronic obstructive pulmonary disease, unspecified: Secondary | ICD-10-CM | POA: Diagnosis not present

## 2020-11-29 DIAGNOSIS — N185 Chronic kidney disease, stage 5: Secondary | ICD-10-CM | POA: Diagnosis not present

## 2020-11-29 DIAGNOSIS — I672 Cerebral atherosclerosis: Secondary | ICD-10-CM | POA: Diagnosis not present

## 2020-11-29 DIAGNOSIS — Z9981 Dependence on supplemental oxygen: Secondary | ICD-10-CM | POA: Diagnosis not present

## 2020-11-29 DIAGNOSIS — E611 Iron deficiency: Secondary | ICD-10-CM | POA: Diagnosis not present

## 2020-12-14 DIAGNOSIS — H903 Sensorineural hearing loss, bilateral: Secondary | ICD-10-CM | POA: Diagnosis not present

## 2020-12-18 DIAGNOSIS — H905 Unspecified sensorineural hearing loss: Secondary | ICD-10-CM | POA: Diagnosis not present

## 2021-01-03 DIAGNOSIS — R410 Disorientation, unspecified: Secondary | ICD-10-CM | POA: Diagnosis not present

## 2021-01-03 DIAGNOSIS — G8191 Hemiplegia, unspecified affecting right dominant side: Secondary | ICD-10-CM | POA: Diagnosis not present

## 2021-01-03 DIAGNOSIS — Z743 Need for continuous supervision: Secondary | ICD-10-CM | POA: Diagnosis not present

## 2021-01-08 DIAGNOSIS — J439 Emphysema, unspecified: Secondary | ICD-10-CM | POA: Diagnosis not present

## 2021-01-08 DIAGNOSIS — N189 Chronic kidney disease, unspecified: Secondary | ICD-10-CM | POA: Diagnosis not present

## 2021-01-08 DIAGNOSIS — N183 Chronic kidney disease, stage 3 unspecified: Secondary | ICD-10-CM | POA: Diagnosis not present

## 2021-01-08 DIAGNOSIS — R000 Tachycardia, unspecified: Secondary | ICD-10-CM | POA: Diagnosis not present

## 2021-01-08 DIAGNOSIS — I4510 Unspecified right bundle-branch block: Secondary | ICD-10-CM | POA: Diagnosis not present

## 2021-01-08 DIAGNOSIS — R069 Unspecified abnormalities of breathing: Secondary | ICD-10-CM | POA: Diagnosis not present

## 2021-01-08 DIAGNOSIS — D631 Anemia in chronic kidney disease: Secondary | ICD-10-CM | POA: Diagnosis not present

## 2021-01-08 DIAGNOSIS — J449 Chronic obstructive pulmonary disease, unspecified: Secondary | ICD-10-CM | POA: Diagnosis not present

## 2021-01-08 DIAGNOSIS — Z87891 Personal history of nicotine dependence: Secondary | ICD-10-CM | POA: Diagnosis not present

## 2021-01-08 DIAGNOSIS — R0602 Shortness of breath: Secondary | ICD-10-CM | POA: Diagnosis not present

## 2021-01-08 DIAGNOSIS — I129 Hypertensive chronic kidney disease with stage 1 through stage 4 chronic kidney disease, or unspecified chronic kidney disease: Secondary | ICD-10-CM | POA: Diagnosis not present

## 2021-01-18 DIAGNOSIS — I10 Essential (primary) hypertension: Secondary | ICD-10-CM | POA: Diagnosis not present

## 2021-01-18 DIAGNOSIS — I509 Heart failure, unspecified: Secondary | ICD-10-CM | POA: Diagnosis not present

## 2021-01-18 DIAGNOSIS — R3914 Feeling of incomplete bladder emptying: Secondary | ICD-10-CM | POA: Diagnosis not present

## 2021-01-18 DIAGNOSIS — E211 Secondary hyperparathyroidism, not elsewhere classified: Secondary | ICD-10-CM | POA: Diagnosis not present

## 2021-01-18 DIAGNOSIS — E559 Vitamin D deficiency, unspecified: Secondary | ICD-10-CM | POA: Diagnosis not present

## 2021-01-18 DIAGNOSIS — N189 Chronic kidney disease, unspecified: Secondary | ICD-10-CM | POA: Diagnosis not present

## 2021-01-18 DIAGNOSIS — J449 Chronic obstructive pulmonary disease, unspecified: Secondary | ICD-10-CM | POA: Diagnosis not present

## 2021-01-18 DIAGNOSIS — J9610 Chronic respiratory failure, unspecified whether with hypoxia or hypercapnia: Secondary | ICD-10-CM | POA: Diagnosis not present

## 2021-01-18 DIAGNOSIS — N184 Chronic kidney disease, stage 4 (severe): Secondary | ICD-10-CM | POA: Diagnosis not present

## 2021-01-18 DIAGNOSIS — E039 Hypothyroidism, unspecified: Secondary | ICD-10-CM | POA: Diagnosis not present

## 2021-01-18 DIAGNOSIS — N179 Acute kidney failure, unspecified: Secondary | ICD-10-CM | POA: Diagnosis not present

## 2021-01-18 DIAGNOSIS — D649 Anemia, unspecified: Secondary | ICD-10-CM | POA: Diagnosis not present

## 2021-01-26 DIAGNOSIS — J439 Emphysema, unspecified: Secondary | ICD-10-CM | POA: Diagnosis not present

## 2021-01-26 DIAGNOSIS — R0602 Shortness of breath: Secondary | ICD-10-CM | POA: Diagnosis not present

## 2021-01-26 DIAGNOSIS — I420 Dilated cardiomyopathy: Secondary | ICD-10-CM | POA: Diagnosis not present

## 2021-01-26 DIAGNOSIS — I272 Pulmonary hypertension, unspecified: Secondary | ICD-10-CM | POA: Diagnosis not present

## 2021-01-26 DIAGNOSIS — I255 Ischemic cardiomyopathy: Secondary | ICD-10-CM | POA: Diagnosis not present

## 2021-01-26 DIAGNOSIS — J449 Chronic obstructive pulmonary disease, unspecified: Secondary | ICD-10-CM | POA: Diagnosis not present

## 2021-01-26 DIAGNOSIS — I214 Non-ST elevation (NSTEMI) myocardial infarction: Secondary | ICD-10-CM | POA: Diagnosis not present

## 2021-01-26 DIAGNOSIS — Z9889 Other specified postprocedural states: Secondary | ICD-10-CM | POA: Diagnosis not present

## 2021-01-27 DIAGNOSIS — R0602 Shortness of breath: Secondary | ICD-10-CM | POA: Diagnosis not present

## 2021-02-08 DIAGNOSIS — R110 Nausea: Secondary | ICD-10-CM | POA: Diagnosis not present

## 2021-02-08 DIAGNOSIS — D509 Iron deficiency anemia, unspecified: Secondary | ICD-10-CM | POA: Diagnosis not present

## 2021-02-21 DIAGNOSIS — N3000 Acute cystitis without hematuria: Secondary | ICD-10-CM | POA: Diagnosis not present

## 2021-02-21 DIAGNOSIS — N319 Neuromuscular dysfunction of bladder, unspecified: Secondary | ICD-10-CM | POA: Diagnosis not present

## 2021-02-21 DIAGNOSIS — R8290 Unspecified abnormal findings in urine: Secondary | ICD-10-CM | POA: Diagnosis not present

## 2021-02-21 DIAGNOSIS — N390 Urinary tract infection, site not specified: Secondary | ICD-10-CM | POA: Diagnosis not present

## 2021-02-23 DIAGNOSIS — J8410 Pulmonary fibrosis, unspecified: Secondary | ICD-10-CM | POA: Diagnosis not present

## 2021-02-23 DIAGNOSIS — Z9981 Dependence on supplemental oxygen: Secondary | ICD-10-CM | POA: Diagnosis not present

## 2021-02-23 DIAGNOSIS — J9611 Chronic respiratory failure with hypoxia: Secondary | ICD-10-CM | POA: Diagnosis not present

## 2021-02-23 DIAGNOSIS — I672 Cerebral atherosclerosis: Secondary | ICD-10-CM | POA: Diagnosis not present

## 2021-02-23 DIAGNOSIS — I509 Heart failure, unspecified: Secondary | ICD-10-CM | POA: Diagnosis not present

## 2021-02-23 DIAGNOSIS — E611 Iron deficiency: Secondary | ICD-10-CM | POA: Diagnosis not present

## 2021-02-23 DIAGNOSIS — N185 Chronic kidney disease, stage 5: Secondary | ICD-10-CM | POA: Diagnosis not present

## 2021-02-23 DIAGNOSIS — J449 Chronic obstructive pulmonary disease, unspecified: Secondary | ICD-10-CM | POA: Diagnosis not present

## 2021-02-24 DIAGNOSIS — N185 Chronic kidney disease, stage 5: Secondary | ICD-10-CM | POA: Diagnosis not present

## 2021-02-24 DIAGNOSIS — J449 Chronic obstructive pulmonary disease, unspecified: Secondary | ICD-10-CM | POA: Diagnosis not present

## 2021-02-24 DIAGNOSIS — J8410 Pulmonary fibrosis, unspecified: Secondary | ICD-10-CM | POA: Diagnosis not present

## 2021-02-24 DIAGNOSIS — I509 Heart failure, unspecified: Secondary | ICD-10-CM | POA: Diagnosis not present

## 2021-02-26 DIAGNOSIS — N318 Other neuromuscular dysfunction of bladder: Secondary | ICD-10-CM | POA: Diagnosis not present

## 2021-02-26 DIAGNOSIS — Z87440 Personal history of urinary (tract) infections: Secondary | ICD-10-CM | POA: Diagnosis not present

## 2021-03-26 DIAGNOSIS — J449 Chronic obstructive pulmonary disease, unspecified: Secondary | ICD-10-CM | POA: Diagnosis not present

## 2021-03-26 DIAGNOSIS — J8410 Pulmonary fibrosis, unspecified: Secondary | ICD-10-CM | POA: Diagnosis not present

## 2021-03-26 DIAGNOSIS — N185 Chronic kidney disease, stage 5: Secondary | ICD-10-CM | POA: Diagnosis not present

## 2021-03-26 DIAGNOSIS — I509 Heart failure, unspecified: Secondary | ICD-10-CM | POA: Diagnosis not present

## 2021-03-29 DIAGNOSIS — N185 Chronic kidney disease, stage 5: Secondary | ICD-10-CM | POA: Diagnosis not present

## 2021-03-29 DIAGNOSIS — J8410 Pulmonary fibrosis, unspecified: Secondary | ICD-10-CM | POA: Diagnosis not present

## 2021-03-29 DIAGNOSIS — J449 Chronic obstructive pulmonary disease, unspecified: Secondary | ICD-10-CM | POA: Diagnosis not present

## 2021-03-29 DIAGNOSIS — R110 Nausea: Secondary | ICD-10-CM | POA: Diagnosis not present

## 2021-03-29 DIAGNOSIS — I672 Cerebral atherosclerosis: Secondary | ICD-10-CM | POA: Diagnosis not present

## 2021-03-29 DIAGNOSIS — E611 Iron deficiency: Secondary | ICD-10-CM | POA: Diagnosis not present

## 2021-03-29 DIAGNOSIS — J9611 Chronic respiratory failure with hypoxia: Secondary | ICD-10-CM | POA: Diagnosis not present

## 2021-03-29 DIAGNOSIS — Z9981 Dependence on supplemental oxygen: Secondary | ICD-10-CM | POA: Diagnosis not present

## 2021-04-17 DIAGNOSIS — I10 Essential (primary) hypertension: Secondary | ICD-10-CM | POA: Diagnosis not present

## 2021-04-17 DIAGNOSIS — N184 Chronic kidney disease, stage 4 (severe): Secondary | ICD-10-CM | POA: Diagnosis not present

## 2021-04-17 DIAGNOSIS — R3914 Feeling of incomplete bladder emptying: Secondary | ICD-10-CM | POA: Diagnosis not present

## 2021-04-17 DIAGNOSIS — R809 Proteinuria, unspecified: Secondary | ICD-10-CM | POA: Diagnosis not present

## 2021-04-17 DIAGNOSIS — J449 Chronic obstructive pulmonary disease, unspecified: Secondary | ICD-10-CM | POA: Diagnosis not present

## 2021-04-17 DIAGNOSIS — E211 Secondary hyperparathyroidism, not elsewhere classified: Secondary | ICD-10-CM | POA: Diagnosis not present

## 2021-04-17 DIAGNOSIS — D649 Anemia, unspecified: Secondary | ICD-10-CM | POA: Diagnosis not present

## 2021-04-17 DIAGNOSIS — E559 Vitamin D deficiency, unspecified: Secondary | ICD-10-CM | POA: Diagnosis not present

## 2021-04-17 DIAGNOSIS — I509 Heart failure, unspecified: Secondary | ICD-10-CM | POA: Diagnosis not present

## 2021-04-17 DIAGNOSIS — N189 Chronic kidney disease, unspecified: Secondary | ICD-10-CM | POA: Diagnosis not present

## 2021-04-17 DIAGNOSIS — J9610 Chronic respiratory failure, unspecified whether with hypoxia or hypercapnia: Secondary | ICD-10-CM | POA: Diagnosis not present

## 2021-04-17 DIAGNOSIS — R309 Painful micturition, unspecified: Secondary | ICD-10-CM | POA: Diagnosis not present

## 2021-04-26 DIAGNOSIS — I509 Heart failure, unspecified: Secondary | ICD-10-CM | POA: Diagnosis not present

## 2021-04-26 DIAGNOSIS — J449 Chronic obstructive pulmonary disease, unspecified: Secondary | ICD-10-CM | POA: Diagnosis not present

## 2021-04-26 DIAGNOSIS — J8410 Pulmonary fibrosis, unspecified: Secondary | ICD-10-CM | POA: Diagnosis not present

## 2021-04-26 DIAGNOSIS — N185 Chronic kidney disease, stage 5: Secondary | ICD-10-CM | POA: Diagnosis not present

## 2021-05-02 DIAGNOSIS — R339 Retention of urine, unspecified: Secondary | ICD-10-CM | POA: Diagnosis not present

## 2021-05-02 DIAGNOSIS — N319 Neuromuscular dysfunction of bladder, unspecified: Secondary | ICD-10-CM | POA: Diagnosis not present

## 2021-05-24 DIAGNOSIS — J449 Chronic obstructive pulmonary disease, unspecified: Secondary | ICD-10-CM | POA: Diagnosis not present

## 2021-05-24 DIAGNOSIS — I420 Dilated cardiomyopathy: Secondary | ICD-10-CM | POA: Diagnosis not present

## 2021-05-24 DIAGNOSIS — I255 Ischemic cardiomyopathy: Secondary | ICD-10-CM | POA: Diagnosis not present

## 2021-05-24 DIAGNOSIS — I272 Pulmonary hypertension, unspecified: Secondary | ICD-10-CM | POA: Diagnosis not present

## 2021-05-24 DIAGNOSIS — I214 Non-ST elevation (NSTEMI) myocardial infarction: Secondary | ICD-10-CM | POA: Diagnosis not present

## 2021-05-27 DIAGNOSIS — N185 Chronic kidney disease, stage 5: Secondary | ICD-10-CM | POA: Diagnosis not present

## 2021-05-27 DIAGNOSIS — J449 Chronic obstructive pulmonary disease, unspecified: Secondary | ICD-10-CM | POA: Diagnosis not present

## 2021-05-27 DIAGNOSIS — J8410 Pulmonary fibrosis, unspecified: Secondary | ICD-10-CM | POA: Diagnosis not present

## 2021-05-27 DIAGNOSIS — I509 Heart failure, unspecified: Secondary | ICD-10-CM | POA: Diagnosis not present

## 2021-06-26 DIAGNOSIS — I509 Heart failure, unspecified: Secondary | ICD-10-CM | POA: Diagnosis not present

## 2021-06-26 DIAGNOSIS — J8410 Pulmonary fibrosis, unspecified: Secondary | ICD-10-CM | POA: Diagnosis not present

## 2021-06-26 DIAGNOSIS — N185 Chronic kidney disease, stage 5: Secondary | ICD-10-CM | POA: Diagnosis not present

## 2021-06-26 DIAGNOSIS — J449 Chronic obstructive pulmonary disease, unspecified: Secondary | ICD-10-CM | POA: Diagnosis not present

## 2021-07-04 DIAGNOSIS — Z9981 Dependence on supplemental oxygen: Secondary | ICD-10-CM | POA: Diagnosis not present

## 2021-07-04 DIAGNOSIS — I493 Ventricular premature depolarization: Secondary | ICD-10-CM | POA: Diagnosis not present

## 2021-07-04 DIAGNOSIS — R0902 Hypoxemia: Secondary | ICD-10-CM | POA: Diagnosis not present

## 2021-07-04 DIAGNOSIS — I700 Atherosclerosis of aorta: Secondary | ICD-10-CM | POA: Diagnosis not present

## 2021-07-04 DIAGNOSIS — Z9114 Patient's other noncompliance with medication regimen: Secondary | ICD-10-CM | POA: Diagnosis not present

## 2021-07-04 DIAGNOSIS — Z743 Need for continuous supervision: Secondary | ICD-10-CM | POA: Diagnosis not present

## 2021-07-04 DIAGNOSIS — R0689 Other abnormalities of breathing: Secondary | ICD-10-CM | POA: Diagnosis not present

## 2021-07-04 DIAGNOSIS — R531 Weakness: Secondary | ICD-10-CM | POA: Diagnosis not present

## 2021-07-04 DIAGNOSIS — R42 Dizziness and giddiness: Secondary | ICD-10-CM | POA: Diagnosis not present

## 2021-07-04 DIAGNOSIS — J441 Chronic obstructive pulmonary disease with (acute) exacerbation: Secondary | ICD-10-CM | POA: Diagnosis not present

## 2021-07-04 DIAGNOSIS — I252 Old myocardial infarction: Secondary | ICD-10-CM | POA: Diagnosis not present

## 2021-07-04 DIAGNOSIS — I959 Hypotension, unspecified: Secondary | ICD-10-CM | POA: Diagnosis not present

## 2021-07-16 DIAGNOSIS — I672 Cerebral atherosclerosis: Secondary | ICD-10-CM | POA: Diagnosis not present

## 2021-07-16 DIAGNOSIS — J9611 Chronic respiratory failure with hypoxia: Secondary | ICD-10-CM | POA: Diagnosis not present

## 2021-07-16 DIAGNOSIS — Z9981 Dependence on supplemental oxygen: Secondary | ICD-10-CM | POA: Diagnosis not present

## 2021-07-16 DIAGNOSIS — E611 Iron deficiency: Secondary | ICD-10-CM | POA: Diagnosis not present

## 2021-07-16 DIAGNOSIS — R110 Nausea: Secondary | ICD-10-CM | POA: Diagnosis not present

## 2021-07-16 DIAGNOSIS — J449 Chronic obstructive pulmonary disease, unspecified: Secondary | ICD-10-CM | POA: Diagnosis not present

## 2021-07-16 DIAGNOSIS — N185 Chronic kidney disease, stage 5: Secondary | ICD-10-CM | POA: Diagnosis not present

## 2021-07-16 DIAGNOSIS — I509 Heart failure, unspecified: Secondary | ICD-10-CM | POA: Diagnosis not present

## 2021-07-16 DIAGNOSIS — J8410 Pulmonary fibrosis, unspecified: Secondary | ICD-10-CM | POA: Diagnosis not present

## 2021-07-17 DIAGNOSIS — R6889 Other general symptoms and signs: Secondary | ICD-10-CM | POA: Diagnosis not present

## 2021-07-17 DIAGNOSIS — Z743 Need for continuous supervision: Secondary | ICD-10-CM | POA: Diagnosis not present

## 2021-07-17 DIAGNOSIS — J449 Chronic obstructive pulmonary disease, unspecified: Secondary | ICD-10-CM | POA: Diagnosis not present

## 2021-07-17 DIAGNOSIS — R059 Cough, unspecified: Secondary | ICD-10-CM | POA: Diagnosis not present

## 2021-07-17 DIAGNOSIS — Z87891 Personal history of nicotine dependence: Secondary | ICD-10-CM | POA: Diagnosis not present

## 2021-07-17 DIAGNOSIS — J439 Emphysema, unspecified: Secondary | ICD-10-CM | POA: Diagnosis not present

## 2021-07-17 DIAGNOSIS — I517 Cardiomegaly: Secondary | ICD-10-CM | POA: Diagnosis not present

## 2021-07-17 DIAGNOSIS — Z9981 Dependence on supplemental oxygen: Secondary | ICD-10-CM | POA: Diagnosis not present

## 2021-07-18 DIAGNOSIS — J449 Chronic obstructive pulmonary disease, unspecified: Secondary | ICD-10-CM | POA: Diagnosis not present

## 2021-07-18 DIAGNOSIS — J439 Emphysema, unspecified: Secondary | ICD-10-CM | POA: Diagnosis not present

## 2021-07-18 DIAGNOSIS — R059 Cough, unspecified: Secondary | ICD-10-CM | POA: Diagnosis not present

## 2021-07-18 DIAGNOSIS — I517 Cardiomegaly: Secondary | ICD-10-CM | POA: Diagnosis not present

## 2021-07-27 DIAGNOSIS — N185 Chronic kidney disease, stage 5: Secondary | ICD-10-CM | POA: Diagnosis not present

## 2021-07-27 DIAGNOSIS — I509 Heart failure, unspecified: Secondary | ICD-10-CM | POA: Diagnosis not present

## 2021-07-27 DIAGNOSIS — J8410 Pulmonary fibrosis, unspecified: Secondary | ICD-10-CM | POA: Diagnosis not present

## 2021-07-27 DIAGNOSIS — J449 Chronic obstructive pulmonary disease, unspecified: Secondary | ICD-10-CM | POA: Diagnosis not present

## 2021-08-21 DIAGNOSIS — I672 Cerebral atherosclerosis: Secondary | ICD-10-CM | POA: Diagnosis not present

## 2021-08-21 DIAGNOSIS — J9611 Chronic respiratory failure with hypoxia: Secondary | ICD-10-CM | POA: Diagnosis not present

## 2021-08-21 DIAGNOSIS — N185 Chronic kidney disease, stage 5: Secondary | ICD-10-CM | POA: Diagnosis not present

## 2021-08-21 DIAGNOSIS — I509 Heart failure, unspecified: Secondary | ICD-10-CM | POA: Diagnosis not present

## 2021-08-21 DIAGNOSIS — I10 Essential (primary) hypertension: Secondary | ICD-10-CM | POA: Diagnosis not present

## 2021-08-21 DIAGNOSIS — J449 Chronic obstructive pulmonary disease, unspecified: Secondary | ICD-10-CM | POA: Diagnosis not present

## 2021-08-21 DIAGNOSIS — R0902 Hypoxemia: Secondary | ICD-10-CM | POA: Diagnosis not present

## 2021-08-21 DIAGNOSIS — Z9981 Dependence on supplemental oxygen: Secondary | ICD-10-CM | POA: Diagnosis not present

## 2021-08-21 DIAGNOSIS — J8410 Pulmonary fibrosis, unspecified: Secondary | ICD-10-CM | POA: Diagnosis not present

## 2021-08-21 DIAGNOSIS — E611 Iron deficiency: Secondary | ICD-10-CM | POA: Diagnosis not present

## 2021-08-26 DIAGNOSIS — J8410 Pulmonary fibrosis, unspecified: Secondary | ICD-10-CM | POA: Diagnosis not present

## 2021-08-26 DIAGNOSIS — N185 Chronic kidney disease, stage 5: Secondary | ICD-10-CM | POA: Diagnosis not present

## 2021-08-26 DIAGNOSIS — I509 Heart failure, unspecified: Secondary | ICD-10-CM | POA: Diagnosis not present

## 2021-08-26 DIAGNOSIS — J449 Chronic obstructive pulmonary disease, unspecified: Secondary | ICD-10-CM | POA: Diagnosis not present

## 2021-09-26 DIAGNOSIS — J8410 Pulmonary fibrosis, unspecified: Secondary | ICD-10-CM | POA: Diagnosis not present

## 2021-09-26 DIAGNOSIS — I509 Heart failure, unspecified: Secondary | ICD-10-CM | POA: Diagnosis not present

## 2021-09-26 DIAGNOSIS — J9611 Chronic respiratory failure with hypoxia: Secondary | ICD-10-CM | POA: Diagnosis not present

## 2021-09-26 DIAGNOSIS — R69 Illness, unspecified: Secondary | ICD-10-CM | POA: Diagnosis not present

## 2021-09-26 DIAGNOSIS — E611 Iron deficiency: Secondary | ICD-10-CM | POA: Diagnosis not present

## 2021-09-26 DIAGNOSIS — N185 Chronic kidney disease, stage 5: Secondary | ICD-10-CM | POA: Diagnosis not present

## 2021-09-26 DIAGNOSIS — N401 Enlarged prostate with lower urinary tract symptoms: Secondary | ICD-10-CM | POA: Diagnosis not present

## 2021-09-26 DIAGNOSIS — I672 Cerebral atherosclerosis: Secondary | ICD-10-CM | POA: Diagnosis not present

## 2021-09-26 DIAGNOSIS — J449 Chronic obstructive pulmonary disease, unspecified: Secondary | ICD-10-CM | POA: Diagnosis not present

## 2021-09-26 DIAGNOSIS — Z9981 Dependence on supplemental oxygen: Secondary | ICD-10-CM | POA: Diagnosis not present

## 2021-10-02 DIAGNOSIS — I10 Essential (primary) hypertension: Secondary | ICD-10-CM | POA: Diagnosis not present

## 2021-10-02 DIAGNOSIS — N184 Chronic kidney disease, stage 4 (severe): Secondary | ICD-10-CM | POA: Diagnosis not present

## 2021-10-02 DIAGNOSIS — I509 Heart failure, unspecified: Secondary | ICD-10-CM | POA: Diagnosis not present

## 2021-10-02 DIAGNOSIS — N189 Chronic kidney disease, unspecified: Secondary | ICD-10-CM | POA: Diagnosis not present

## 2021-10-02 DIAGNOSIS — D649 Anemia, unspecified: Secondary | ICD-10-CM | POA: Diagnosis not present

## 2021-10-02 DIAGNOSIS — J449 Chronic obstructive pulmonary disease, unspecified: Secondary | ICD-10-CM | POA: Diagnosis not present

## 2021-10-02 DIAGNOSIS — J9610 Chronic respiratory failure, unspecified whether with hypoxia or hypercapnia: Secondary | ICD-10-CM | POA: Diagnosis not present

## 2021-10-09 DIAGNOSIS — R809 Proteinuria, unspecified: Secondary | ICD-10-CM | POA: Diagnosis not present

## 2021-10-09 DIAGNOSIS — R309 Painful micturition, unspecified: Secondary | ICD-10-CM | POA: Diagnosis not present

## 2021-10-12 DIAGNOSIS — M549 Dorsalgia, unspecified: Secondary | ICD-10-CM | POA: Diagnosis not present

## 2021-10-12 DIAGNOSIS — Z743 Need for continuous supervision: Secondary | ICD-10-CM | POA: Diagnosis not present

## 2021-10-12 DIAGNOSIS — M545 Low back pain, unspecified: Secondary | ICD-10-CM | POA: Diagnosis not present

## 2021-10-15 DIAGNOSIS — Z743 Need for continuous supervision: Secondary | ICD-10-CM | POA: Diagnosis not present

## 2021-10-15 DIAGNOSIS — R404 Transient alteration of awareness: Secondary | ICD-10-CM | POA: Diagnosis not present

## 2021-10-15 DIAGNOSIS — I469 Cardiac arrest, cause unspecified: Secondary | ICD-10-CM | POA: Diagnosis not present

## 2021-11-07 DIAGNOSIS — 419620001 Death: Secondary | SNOMED CT | POA: Diagnosis not present

## 2021-11-07 DEATH — deceased

## 2022-09-24 DIAGNOSIS — R404 Transient alteration of awareness: Secondary | ICD-10-CM | POA: Diagnosis not present

## 2022-09-24 DIAGNOSIS — R0902 Hypoxemia: Secondary | ICD-10-CM | POA: Diagnosis not present

## 2022-09-24 DIAGNOSIS — G40909 Epilepsy, unspecified, not intractable, without status epilepticus: Secondary | ICD-10-CM | POA: Diagnosis not present

## 2022-09-24 DIAGNOSIS — Z9989 Dependence on other enabling machines and devices: Secondary | ICD-10-CM | POA: Diagnosis not present

## 2022-12-04 DIAGNOSIS — I6523 Occlusion and stenosis of bilateral carotid arteries: Secondary | ICD-10-CM | POA: Diagnosis not present

## 2022-12-25 DIAGNOSIS — G459 Transient cerebral ischemic attack, unspecified: Secondary | ICD-10-CM | POA: Diagnosis not present

## 2023-01-06 DIAGNOSIS — I6523 Occlusion and stenosis of bilateral carotid arteries: Secondary | ICD-10-CM | POA: Diagnosis not present

## 2023-02-11 DIAGNOSIS — I6522 Occlusion and stenosis of left carotid artery: Secondary | ICD-10-CM | POA: Diagnosis not present

## 2023-03-10 DEATH — deceased

## 2023-12-11 DIAGNOSIS — E119 Type 2 diabetes mellitus without complications: Secondary | ICD-10-CM | POA: Diagnosis not present

## 2023-12-11 DIAGNOSIS — E785 Hyperlipidemia, unspecified: Secondary | ICD-10-CM | POA: Diagnosis not present

## 2023-12-11 DIAGNOSIS — E038 Other specified hypothyroidism: Secondary | ICD-10-CM | POA: Diagnosis not present

## 2024-04-22 DIAGNOSIS — Z7982 Long term (current) use of aspirin: Secondary | ICD-10-CM | POA: Diagnosis not present

## 2024-04-22 DIAGNOSIS — Z880 Allergy status to penicillin: Secondary | ICD-10-CM | POA: Diagnosis not present

## 2024-04-22 DIAGNOSIS — Z7901 Long term (current) use of anticoagulants: Secondary | ICD-10-CM | POA: Diagnosis not present

## 2024-04-22 DIAGNOSIS — L02211 Cutaneous abscess of abdominal wall: Secondary | ICD-10-CM | POA: Diagnosis not present

## 2024-04-22 DIAGNOSIS — Z8673 Personal history of transient ischemic attack (TIA), and cerebral infarction without residual deficits: Secondary | ICD-10-CM | POA: Diagnosis not present

## 2024-05-12 DIAGNOSIS — Z880 Allergy status to penicillin: Secondary | ICD-10-CM | POA: Diagnosis not present

## 2024-05-12 DIAGNOSIS — N289 Disorder of kidney and ureter, unspecified: Secondary | ICD-10-CM | POA: Diagnosis not present

## 2024-05-12 DIAGNOSIS — Z8673 Personal history of transient ischemic attack (TIA), and cerebral infarction without residual deficits: Secondary | ICD-10-CM | POA: Diagnosis not present

## 2024-05-12 DIAGNOSIS — E876 Hypokalemia: Secondary | ICD-10-CM | POA: Diagnosis not present

## 2024-05-12 DIAGNOSIS — T85518A Breakdown (mechanical) of other gastrointestinal prosthetic devices, implants and grafts, initial encounter: Secondary | ICD-10-CM | POA: Diagnosis not present

## 2024-05-12 DIAGNOSIS — D649 Anemia, unspecified: Secondary | ICD-10-CM | POA: Diagnosis not present
